# Patient Record
Sex: Female | Born: 1970
Health system: Southern US, Community
[De-identification: ages and names within clinical notes are randomized; demographics above are authoritative.]

## PROBLEM LIST (undated history)

## (undated) DIAGNOSIS — I341 Nonrheumatic mitral (valve) prolapse: Secondary | ICD-10-CM

## (undated) DIAGNOSIS — T7840XA Allergy, unspecified, initial encounter: Secondary | ICD-10-CM

## (undated) DIAGNOSIS — R011 Cardiac murmur, unspecified: Secondary | ICD-10-CM

## (undated) DIAGNOSIS — D649 Anemia, unspecified: Secondary | ICD-10-CM

## (undated) DIAGNOSIS — G8929 Other chronic pain: Secondary | ICD-10-CM

## (undated) DIAGNOSIS — F32A Depression, unspecified: Secondary | ICD-10-CM

## (undated) DIAGNOSIS — M797 Fibromyalgia: Secondary | ICD-10-CM

## (undated) DIAGNOSIS — G709 Myoneural disorder, unspecified: Secondary | ICD-10-CM

## (undated) DIAGNOSIS — F419 Anxiety disorder, unspecified: Secondary | ICD-10-CM

## (undated) DIAGNOSIS — K219 Gastro-esophageal reflux disease without esophagitis: Secondary | ICD-10-CM

## (undated) DIAGNOSIS — M549 Dorsalgia, unspecified: Secondary | ICD-10-CM

## (undated) DIAGNOSIS — G4700 Insomnia, unspecified: Secondary | ICD-10-CM

## (undated) DIAGNOSIS — I10 Essential (primary) hypertension: Secondary | ICD-10-CM

## (undated) DIAGNOSIS — F329 Major depressive disorder, single episode, unspecified: Secondary | ICD-10-CM

## (undated) DIAGNOSIS — G43909 Migraine, unspecified, not intractable, without status migrainosus: Secondary | ICD-10-CM

## (undated) DIAGNOSIS — K589 Irritable bowel syndrome without diarrhea: Secondary | ICD-10-CM

## (undated) HISTORY — DX: Gastro-esophageal reflux disease without esophagitis: K21.9

## (undated) HISTORY — DX: Nonrheumatic mitral (valve) prolapse: I34.1

## (undated) HISTORY — DX: Fibromyalgia: M79.7

## (undated) HISTORY — DX: Essential (primary) hypertension: I10

## (undated) HISTORY — DX: Anemia, unspecified: D64.9

## (undated) HISTORY — DX: Other chronic pain: G89.29

## (undated) HISTORY — PX: DILATION AND CURETTAGE OF UTERUS: SHX78

## (undated) HISTORY — DX: Dorsalgia, unspecified: M54.9

## (undated) HISTORY — PX: TONSILLECTOMY: SUR1361

## (undated) HISTORY — PX: CHOLECYSTECTOMY: SHX55

## (undated) HISTORY — DX: Major depressive disorder, single episode, unspecified: F32.9

## (undated) HISTORY — PX: SPINE SURGERY: SHX786

## (undated) HISTORY — PX: BACK SURGERY: SHX140

## (undated) HISTORY — DX: Migraine, unspecified, not intractable, without status migrainosus: G43.909

## (undated) HISTORY — DX: Myoneural disorder, unspecified: G70.9

## (undated) HISTORY — DX: Irritable bowel syndrome without diarrhea: K58.9

## (undated) HISTORY — DX: Allergy, unspecified, initial encounter: T78.40XA

## (undated) HISTORY — DX: Insomnia, unspecified: G47.00

## (undated) HISTORY — DX: Anxiety disorder, unspecified: F41.9

## (undated) HISTORY — DX: Cardiac murmur, unspecified: R01.1

## (undated) HISTORY — DX: Depression, unspecified: F32.A

---

## 2016-10-29 ENCOUNTER — Encounter: Payer: Self-pay | Admitting: Gastroenterology

## 2016-11-01 DIAGNOSIS — F17210 Nicotine dependence, cigarettes, uncomplicated: Secondary | ICD-10-CM | POA: Diagnosis not present

## 2016-11-01 DIAGNOSIS — K588 Other irritable bowel syndrome: Secondary | ICD-10-CM | POA: Diagnosis not present

## 2016-11-01 DIAGNOSIS — F331 Major depressive disorder, recurrent, moderate: Secondary | ICD-10-CM | POA: Diagnosis not present

## 2016-11-01 DIAGNOSIS — M797 Fibromyalgia: Secondary | ICD-10-CM | POA: Diagnosis not present

## 2016-11-01 DIAGNOSIS — Z79891 Long term (current) use of opiate analgesic: Secondary | ICD-10-CM | POA: Diagnosis not present

## 2016-11-01 DIAGNOSIS — M5010 Cervical disc disorder with radiculopathy, unspecified cervical region: Secondary | ICD-10-CM | POA: Diagnosis not present

## 2016-11-01 DIAGNOSIS — G43719 Chronic migraine without aura, intractable, without status migrainosus: Secondary | ICD-10-CM | POA: Diagnosis not present

## 2016-11-01 DIAGNOSIS — M5116 Intervertebral disc disorders with radiculopathy, lumbar region: Secondary | ICD-10-CM | POA: Diagnosis not present

## 2016-11-01 DIAGNOSIS — I349 Nonrheumatic mitral valve disorder, unspecified: Secondary | ICD-10-CM | POA: Diagnosis not present

## 2016-11-01 DIAGNOSIS — M545 Low back pain: Secondary | ICD-10-CM | POA: Diagnosis not present

## 2016-11-23 ENCOUNTER — Ambulatory Visit (INDEPENDENT_AMBULATORY_CARE_PROVIDER_SITE_OTHER): Payer: PPO | Admitting: Gastroenterology

## 2016-11-23 ENCOUNTER — Encounter: Payer: Self-pay | Admitting: Gastroenterology

## 2016-11-23 VITALS — BP 127/91 | HR 103 | Temp 98.1°F | Ht 64.0 in | Wt 150.2 lb

## 2016-11-23 DIAGNOSIS — R197 Diarrhea, unspecified: Secondary | ICD-10-CM

## 2016-11-23 DIAGNOSIS — R1010 Upper abdominal pain, unspecified: Secondary | ICD-10-CM | POA: Insufficient documentation

## 2016-11-23 LAB — COMPLETE METABOLIC PANEL WITH GFR
ALBUMIN: 4 g/dL (ref 3.6–5.1)
ALK PHOS: 70 U/L (ref 33–115)
ALT: 9 U/L (ref 6–29)
AST: 14 U/L (ref 10–30)
BILIRUBIN TOTAL: 0.3 mg/dL (ref 0.2–1.2)
BUN: 6 mg/dL — AB (ref 7–25)
CALCIUM: 8.9 mg/dL (ref 8.6–10.2)
CO2: 29 mmol/L (ref 20–31)
Chloride: 103 mmol/L (ref 98–110)
Creat: 0.69 mg/dL (ref 0.50–1.10)
GFR, Est African American: >89 mL/min (ref >=60)
GFR, Est Non African American: >89 mL/min (ref >=60)
GLUCOSE: 89 mg/dL (ref 65–99)
POTASSIUM: 4 mmol/L (ref 3.5–5.3)
SODIUM: 136 mmol/L (ref 135–146)
TOTAL PROTEIN: 7.1 g/dL (ref 6.1–8.1)

## 2016-11-23 LAB — CBC
HEMATOCRIT: 44.9 % (ref 35.0–45.0)
HEMOGLOBIN: 15.3 g/dL (ref 11.7–15.5)
MCH: 34.5 pg — AB (ref 27.0–33.0)
MCHC: 34.1 g/dL (ref 32.0–36.0)
MCV: 101.1 fL — ABNORMAL HIGH (ref 80.0–100.0)
MPV: 10.8 fL (ref 7.5–12.5)
Platelets: 250 10*3/uL (ref 140–400)
RBC: 4.44 MIL/uL (ref 3.80–5.10)
RDW: 13.8 % (ref 11.0–15.0)
WBC: 5.9 10*3/uL (ref 3.8–10.8)

## 2016-11-23 MED ORDER — ONDANSETRON HCL 4 MG PO TABS: 4.0000 mg | ORAL_TABLET | Freq: Three times a day (TID) | ORAL | 3 refills | Status: DC

## 2016-11-23 MED ORDER — DICYCLOMINE HCL 10 MG PO CAPS: 10.0000 mg | ORAL_CAPSULE | Freq: Three times a day (TID) | ORAL | 3 refills | Status: DC

## 2016-11-23 MED ORDER — PANTOPRAZOLE SODIUM 40 MG PO TBEC: 40.0000 mg | DELAYED_RELEASE_TABLET | Freq: Every day | ORAL | 3 refills | Status: DC

## 2016-11-23 NOTE — Assessment & Plan Note (Signed)
46 year old female with chronic postprandial loose stool, likely IBS. Remote colonoscopy in [place], and we will request records. No rectal bleeding, weight loss, or alarm signs. Gallbladder absent, so Viberzi is not an option. Start Bentyl before meals and bedtime as needed. Further recommendations after receiving colonoscopy reports.

## 2016-11-23 NOTE — Patient Instructions (Signed)
Please have blood work done today.   Stop/limit [employer] if at all possible.  Start taking these things: 1. Protonix once each morning (reflux medication) 2. Bentyl 1 capsule before meals and at bedtime, watching for dry mouth and constipation (for diarrhea) 3. Zofran scheduled with meals (for nausea) and may take at bedtime if needed.   We will get the last reports of procedures you had done. I anticipate you will need a colonoscopy and upper endoscopy in the near future.

## 2016-11-23 NOTE — Progress Notes (Signed)
_   Primary Care Physician:  Ernestine ConradBLUTH, KIRK, MD Primary Gastroenterologist:  Dr.   Antony Contrashief Complaint  Patient presents with  . Abdominal Pain    upper abd  . Bloated    after eating  . Diarrhea  . Emesis    HPI:   Cynthia Finley is a 46 y.o. female presenting today at the request of   No past medical history on file.  No past surgical history on file.  Current Outpatient Prescriptions  Medication Sig Dispense Refill  . cyclobenzaprine (FLEXERIL) 10 MG tablet Take 10 mg by mouth 3 (three) times daily.    Marland Kitchen. ibuprofen (ADVIL,MOTRIN) 600 MG tablet Take 600 mg by mouth every 6 (six) hours as needed.    . montelukast (SINGULAIR) 10 MG tablet Take 10 mg by mouth as needed.    Marland Kitchen. oxyCODONE-acetaminophen (PERCOCET) 7.5-325 MG tablet Take 1 tablet by mouth 3 (three) times daily.    . promethazine (PHENERGAN) 25 MG tablet Take 25 mg by mouth every 6 (six) hours as needed.    . zolpidem (AMBIEN) 5 MG tablet Take 5 mg by mouth at bedtime.     No current facility-administered medications for this visit.     Allergies as of 11/23/2016 - Review Complete 11/23/2016  Allergen Reaction Noted  . Aspirin  11/23/2016  . Tape Rash 11/23/2016    No family history on file.  Social History   Social History  . Marital status: Married    Spouse name: N/A  . Number of children: N/A  . Years of education: N/A   Occupational History  . Not on file.   Social History Main Topics  . Smoking status: Current Every Day Smoker  . Smokeless tobacco: Never Used  . Alcohol use No  . Drug use: No  . Sexual activity: Not on file   Other Topics Concern  . Not on file   Social History Narrative  . No narrative on file    Review of Systems: Gen: Denies any fever, chills, fatigue, weight loss, lack of appetite.  CV: Denies chest pain, heart palpitations, peripheral edema, syncope.  Resp: Denies shortness of breath at rest or with exertion. Denies wheezing or cough.  GI: Denies dysphagia or  odynophagia. Denies jaundice, hematemesis, fecal incontinence. GU : Denies urinary burning, urinary frequency, urinary hesitancy MS: Denies joint pain, muscle weakness, cramps, or limitation of movement.  Derm: Denies rash, itching, dry skin Psych: Denies depression, anxiety, memory loss, and confusion Heme: Denies bruising, bleeding, and enlarged lymph nodes.  Physical Exam: BP (!) 127/91   Pulse (!) 103   Temp 98.1 F (36.7 C) (Oral)   Ht 5\' 4"  (1.626 m)   Wt 150 lb 3.2 oz (68.1 kg)   BMI 25.78 kg/m  General:   Alert and oriented. Pleasant and cooperative. Well-nourished and well-developed.  Head:  Normocephalic and atraumatic. Eyes:  Without icterus, sclera clear and conjunctiva pink.  Ears:  Normal auditory acuity. Nose:  No deformity, discharge,  or lesions. Mouth:  No deformity or lesions, oral mucosa pink.  Neck:  Supple, without mass or thyromegaly. Lungs:  Clear to auscultation bilaterally. No wheezes, rales, or rhonchi. No distress.  Heart:  S1, S2 present without murmurs appreciated.  Abdomen:  +BS, soft, non-tender and non-distended. No HSM noted. No guarding or rebound. No masses appreciated.  Rectal:  Deferred  Msk:  Symmetrical without gross deformities. Normal posture. Pulses:  Normal pulses noted. Extremities:  Without clubbing or edema. Neurologic:  Alert and  oriented x4;  grossly normal neurologically. Skin:  Intact without significant lesions or rashes. Cervical Nodes:  No significant cervical adenopathy. Psych:  Alert and cooperative. Normal mood and affect.

## 2016-11-23 NOTE — Assessment & Plan Note (Signed)
Present at least 8 months, associated nausea and vomiting. Currently not on a PPI and drinking large amounts of mountain dew and taking Ibuprofen. Last EGD at time of colonoscopy about 5-8 years ago. Limit or avoid Mountain dew, avoid NSAIDs, start Protonix once each morning, Zofran for nausea. If no improvement, may need upper endoscopy. Further recommendations to follow.

## 2016-11-23 NOTE — Progress Notes (Signed)
Primary Care Physician:  Ernestine Conrad, MD Primary Gastroenterologist:  Dr. Darrick Penna   Chief Complaint  Patient presents with  . Abdominal Pain    upper abd  . Bloated    after eating  . Diarrhea  . Emesis    HPI:   Cynthia Finley is a 46 y.o. female presenting today at the request of her primary care provider secondary to abdominal pain and change in bowel habits. She notes colonoscopy/EGD by Dr. Raford Pitcher in [place], [place] possibly 5-8 years ago. Doesn't believe she had polyps. Told she had IBS.   Postprandial loose stool. Anything she eats results in loose stool. Very rare to have a solid BM. At least 5 loose stools a day. Abdominal pain with associated N/V within last 8 months. States urine smells like "crap" for the past 7 months. Abdominal pain upper abdomen. Intermittent, not constant. Occurs without any precipitating or relieving factors. Vomiting episodes sometimes daily, sometimes once or twice a week. No dysphagia. Occasional reflux. No loss of appetite. Afraid to eat because she knows she may have N/V. Gallbladder absent. Ibuprofen once to twice a day. Chronic narcotics for at least 20 years due to back pain. Phenergan prn. Always keeps bottle of anti-diarrheal pills on hand. Had taken Prilosec in the past but hasn't had it refilled in awhile. No rectal bleeding but states sometimes her stool looks "black". Drinks about 10 cans of [employer] approximately a day. No weight loss. Diarrhea present prior to cholecystectomy.   Past Medical History:  Diagnosis Date  . Anxiety   . Chronic back pain   . Depression   . GERD (gastroesophageal reflux disease)   . IBS (irritable bowel syndrome)   . Insomnia   . Migraine   . Mitral valve prolapse     Past Surgical History:  Procedure Laterality Date  . BACK SURGERY     X 6  . CHOLECYSTECTOMY    . DILATION AND CURETTAGE OF UTERUS    . TONSILLECTOMY      Current Outpatient Prescriptions  Medication Sig Dispense Refill    . cyclobenzaprine (FLEXERIL) 10 MG tablet Take 10 mg by mouth 3 (three) times daily.    Marland Kitchen ibuprofen (ADVIL,MOTRIN) 600 MG tablet Take 600 mg by mouth every 6 (six) hours as needed.    . montelukast (SINGULAIR) 10 MG tablet Take 10 mg by mouth as needed.    Marland Kitchen oxyCODONE-acetaminophen (PERCOCET) 7.5-325 MG tablet Take 1 tablet by mouth 3 (three) times daily.    . promethazine (PHENERGAN) 25 MG tablet Take 25 mg by mouth every 6 (six) hours as needed.    . zolpidem (AMBIEN) 5 MG tablet Take 5 mg by mouth at bedtime.    . dicyclomine (BENTYL) 10 MG capsule Take 1 capsule (10 mg total) by mouth 4 (four) times daily -  before meals and at bedtime. 120 capsule 3  . ondansetron (ZOFRAN) 4 MG tablet Take 1 tablet (4 mg total) by mouth 4 (four) times daily -  before meals and at bedtime. 120 tablet 3  . pantoprazole (PROTONIX) 40 MG tablet Take 1 tablet (40 mg total) by mouth daily. Take 30 minutes before breakfast daily. 90 tablet 3   No current facility-administered medications for this visit.     Allergies as of 11/23/2016 - Review Complete 11/23/2016  Allergen Reaction Noted  . Aspirin  11/23/2016  . Tape Rash 11/23/2016    Family History  Problem Relation Age of Onset  . Colon  cancer Neg Hx   . Colon polyps Neg Hx     Social History   Social History  . Marital status: Married    Spouse name: N/A  . Number of children: N/A  . Years of education: N/A   Occupational History  . disability    Social History Main Topics  . Smoking status: Current Every Day Smoker    Packs/day: 1.00    Years: 23.00  . Smokeless tobacco: Never Used  . Alcohol use No  . Drug use: No  . Sexual activity: Not on file   Other Topics Concern  . Not on file   Social History Narrative  . No narrative on file    Review of Systems: As mentioned in HPI   Physical Exam: BP (!) 127/91   Pulse (!) 103   Temp 98.1 F (36.7 C) (Oral)   Ht 5\' 4"  (1.626 m)   Wt 150 lb 3.2 oz (68.1 kg)   BMI 25.78  kg/m  General:   Alert and oriented. Pleasant and cooperative. Well-nourished and well-developed.  Head:  Normocephalic and atraumatic. Eyes:  Without icterus, sclera clear and conjunctiva pink.  Ears:  Normal auditory acuity. Nose:  No deformity, discharge,  or lesions. Mouth:  Poor dentition  Lungs:  Clear to auscultation bilaterally.  Heart:  S1, S2 present without murmurs appreciated.  Abdomen:  +BS, soft, mild epigastric tenderness to palpation and non-distended. No HSM noted. No guarding or rebound. No masses appreciated.  Rectal:  Deferred  Msk:  Symmetrical without gross deformities. Normal posture. Extremities:  Without edema. Neurologic:  Alert and  oriented x4 Skin:  Intact without significant lesions or rashes. Psych:  Alert and cooperative. Normal mood and affect.

## 2016-11-26 LAB — TISSUE TRANSGLUTAMINASE, IGA: Tissue Transglutaminase Ab, IgA: 1 U/mL (ref <4)

## 2016-11-26 LAB — IGA: IGA: 303 mg/dL (ref 81–463)

## 2016-11-26 NOTE — Progress Notes (Signed)
CC'ED TO PCP 

## 2016-11-26 NOTE — Progress Notes (Signed)
Received fax [facility]. There are no EGD or TCS reports. It appears patient was a "no show" with Dr. Ruffin FrederickBarritt on 03/24/08.   How is patient since starting a PPI and Bentyl? If she has no significant improvement, we can pursue TCS/EGD with PROPOFOL with Dr. Darrick PennaFields.   Gelene MinkAnna W. Boone, ANP-BC [facility]

## 2016-11-27 DIAGNOSIS — F331 Major depressive disorder, recurrent, moderate: Secondary | ICD-10-CM | POA: Diagnosis not present

## 2016-11-27 DIAGNOSIS — M797 Fibromyalgia: Secondary | ICD-10-CM | POA: Diagnosis not present

## 2016-11-27 DIAGNOSIS — M5116 Intervertebral disc disorders with radiculopathy, lumbar region: Secondary | ICD-10-CM | POA: Diagnosis not present

## 2016-11-27 DIAGNOSIS — Z79891 Long term (current) use of opiate analgesic: Secondary | ICD-10-CM | POA: Diagnosis not present

## 2016-11-27 DIAGNOSIS — M545 Low back pain: Secondary | ICD-10-CM | POA: Diagnosis not present

## 2016-11-27 DIAGNOSIS — K588 Other irritable bowel syndrome: Secondary | ICD-10-CM | POA: Diagnosis not present

## 2016-11-27 DIAGNOSIS — G43719 Chronic migraine without aura, intractable, without status migrainosus: Secondary | ICD-10-CM | POA: Diagnosis not present

## 2016-11-27 DIAGNOSIS — I349 Nonrheumatic mitral valve disorder, unspecified: Secondary | ICD-10-CM | POA: Diagnosis not present

## 2016-11-27 DIAGNOSIS — F17210 Nicotine dependence, cigarettes, uncomplicated: Secondary | ICD-10-CM | POA: Diagnosis not present

## 2016-11-27 DIAGNOSIS — M5010 Cervical disc disorder with radiculopathy, unspecified cervical region: Secondary | ICD-10-CM | POA: Diagnosis not present

## 2016-11-28 ENCOUNTER — Other Ambulatory Visit: Payer: Self-pay

## 2016-11-28 DIAGNOSIS — R197 Diarrhea, unspecified: Secondary | ICD-10-CM

## 2016-11-28 DIAGNOSIS — R109 Unspecified abdominal pain: Secondary | ICD-10-CM

## 2016-11-28 MED ORDER — PEG 3350-KCL-NA BICARB-NACL 420 G PO SOLR: 4000.0000 mL | ORAL | 0 refills | Status: DC

## 2016-11-28 NOTE — Progress Notes (Signed)
LMOM to call.

## 2016-11-28 NOTE — Progress Notes (Signed)
PT called and said she can't see much difference. She is taking Protonix once a day and the Zofran and Bentyl qid. She hasn't vomited, but has had nausea. Ok to schedule the TCS/EGD.

## 2016-11-28 NOTE — Progress Notes (Signed)
Pt is set up for TCS/EGD/PROPOFOL on 12/18/16 @ 10:30 am . She is aware and instructions are going out in the mail.

## 2016-11-29 ENCOUNTER — Encounter: Payer: Self-pay | Admitting: Gastroenterology

## 2016-11-29 ENCOUNTER — Other Ambulatory Visit: Payer: Self-pay

## 2016-11-30 ENCOUNTER — Encounter: Payer: Self-pay | Admitting: Gastroenterology

## 2016-11-30 NOTE — Progress Notes (Signed)
Ms. Cynthia Finley: Your celiac testing was negative. Your liver numbers are normal. We will proceed with the procedures as planned! (I sent a note in MyChart)

## 2016-12-03 NOTE — Progress Notes (Signed)
Noted  

## 2016-12-04 ENCOUNTER — Telehealth: Payer: Self-pay | Admitting: Gastroenterology

## 2016-12-04 NOTE — Telephone Encounter (Signed)
Can we get any labs from Dr. Pauletta BrownsBluth's office? Specifically CBC. Thanks!  Tobi BastosAnna

## 2016-12-04 NOTE — Telephone Encounter (Signed)
Labs requested

## 2016-12-05 ENCOUNTER — Telehealth: Payer: Self-pay

## 2016-12-05 NOTE — Telephone Encounter (Signed)
pts insurance will only pay for zofran if the pts nausea is caused by one of the following:  1- nausea d/t chemo 2- nausea d/t radiation 3- prevention of post operative N/V 4- pediatric gastroenteritis  5- hyperememsis gravidarum  [facility]

## 2016-12-10 NOTE — Telephone Encounter (Signed)
Received a fax that there were no labs since April 13, 2016. Can we have them send whatever they have? Whatever the last CBC was on file. I am trying to compare it to what was currently done.

## 2016-12-11 NOTE — Telephone Encounter (Signed)
Tried to call. Bad connection.

## 2016-12-11 NOTE — Telephone Encounter (Signed)
Requested last labs and last CBC

## 2016-12-11 NOTE — Telephone Encounter (Signed)
Noted. Will have to use phenergan prn. Patient should have this already.

## 2016-12-12 NOTE — Telephone Encounter (Signed)
LMOM to call.

## 2016-12-12 NOTE — Patient Instructions (Signed)
Cynthia LankKimberly Hauger  12/12/2016     @PREFPERIOPPHARMACY @   Your procedure is scheduled on  12/18/2016  [facility] at  900  A.M.  Call this number if you have problems the morning of surgery:  [phone]   Remember:  Do not eat food or drink liquids after midnight.  Take these medicines the morning of surgery with A SIP OF WATER  Flexaril, singulair, zofran, percocet, protonix, phenergan.   Do not wear jewelry, make-up or nail polish.  Do not wear lotions, powders, or perfumes, or deoderant.  Do not shave 48 hours prior to surgery.  Men may shave face and neck.  Do not bring valuables to the hospital.  [facility] any belongings or valuables.  Contacts, dentures or bridgework may not be worn into surgery.  Leave your suitcase in the car.  After surgery it may be brought to your room.  For patients admitted to the hospital, discharge time will be determined by your treatment team.  Patients discharged the day of surgery will not be allowed to drive home.   Name and phone number of your driver:   family Special instructions:  Follow the diet and prep instructions given to you by Dr Evelina DunField's office.  Please read over the following fact sheets that you were given. Anesthesia Post-op Instructions and Care and Recovery After Surgery       Esophagogastroduodenoscopy Introduction Esophagogastroduodenoscopy (EGD) is a procedure to examine the lining of the esophagus, stomach, and first part of the small intestine (duodenum). This procedure is done to check for problems such as inflammation, bleeding, ulcers, or growths. During this procedure, a long, flexible, lighted tube with a camera attached (endoscope) is inserted down the throat. Tell a health care provider about:  Any allergies you have.  All medicines you are taking, including vitamins, herbs, eye drops, creams, and over-the-counter medicines.  Any problems you or family  members have had with anesthetic medicines.  Any blood disorders you have.  Any surgeries you have had.  Any medical conditions you have.  Whether you are pregnant or may be pregnant. What are the risks? Generally, this is a safe procedure. However, problems may occur, including:  Infection.  Bleeding.  A tear (perforation) in the esophagus, stomach, or duodenum.  Trouble breathing.  Excessive sweating.  Spasms of the larynx.  A slowed heartbeat.  Low blood pressure. What happens before the procedure?  Follow instructions from your health care provider about eating or drinking restrictions.  Ask your health care provider about:  Changing or stopping your regular medicines. This is especially important if you are taking diabetes medicines or blood thinners.  Taking medicines such as aspirin and ibuprofen. These medicines can thin your blood. Do not take these medicines before your procedure if your health care provider instructs you not to.  Plan to have someone take you home after the procedure.  If you wear dentures, be ready to remove them before the procedure. What happens during the procedure?  To reduce your risk of infection, your health care team will wash or sanitize their hands.  An IV tube will be put in a vein in your hand or arm. You will get medicines and fluids through this tube.  You will be given one or more of the following:  A medicine to help you relax (sedative).  A medicine to numb the area (local anesthetic). This  medicine may be sprayed into your throat. It will make you feel more comfortable and keep you from gagging or coughing during the procedure.  A medicine for pain.  A mouth guard may be placed in your mouth to protect your teeth and to keep you from biting on the endoscope.  You will be asked to lie on your left side.  The endoscope will be lowered down your throat into your esophagus, stomach, and duodenum.  Air will be put  into the endoscope. This will help your health care provider see better.  The lining of your esophagus, stomach, and duodenum will be examined.  Your health care provider may:  Take a tissue sample so it can be looked at in a lab (biopsy).  Remove growths.  Remove objects (foreign bodies) that are stuck.  Treat any bleeding with medicines or other devices that stop tissue from bleeding.  Widen (dilate) or stretch narrowed areas of your esophagus and stomach.  The endoscope will be taken out. The procedure may vary among health care providers and hospitals. What happens after the procedure?  Your blood pressure, heart rate, breathing rate, and blood oxygen level will be monitored often until the medicines you were given have worn off.  Do not eat or drink anything until the numbing medicine has worn off and your gag reflex has returned. This information is not intended to replace advice given to you by your health care provider. Make sure you discuss any questions you have with your health care provider. Document Released: 02/08/2005 Document Revised: 03/15/2016 Document Reviewed: 09/01/2015  2017 Elsevier Esophagogastroduodenoscopy, Care After Introduction Refer to this sheet in the next few weeks. These instructions provide you with information about caring for yourself after your procedure. Your health care provider may also give you more specific instructions. Your treatment has been planned according to current medical practices, but problems sometimes occur. Call your health care provider if you have any problems or questions after your procedure. What can I expect after the procedure? After the procedure, it is common to have:  A sore throat.  Nausea.  Bloating.  Dizziness.  Fatigue. Follow these instructions at home:  Do not eat or drink anything until the numbing medicine (local anesthetic) has worn off and your gag reflex has returned. You will know that the local  anesthetic has worn off when you can swallow comfortably.  Do not drive for 24 hours if you received a medicine to help you relax (sedative).  If your health care provider took a tissue sample for testing during the procedure, make sure to get your test results. This is your responsibility. Ask your health care provider or the department performing the test when your results will be ready.  Keep all follow-up visits as told by your health care provider. This is important. Contact a health care provider if:  You cannot stop coughing.  You are not urinating.  You are urinating less than usual. Get help right away if:  You have trouble swallowing.  You cannot eat or drink.  You have throat or chest pain that gets worse.  You are dizzy or light-headed.  You faint.  You have nausea or vomiting.  You have chills.  You have a fever.  You have severe abdominal pain.  You have black, tarry, or bloody stools. This information is not intended to replace advice given to you by your health care provider. Make sure you discuss any questions you have with your health care  provider. Document Released: 09/24/2012 Document Revised: 03/15/2016 Document Reviewed: 09/01/2015  2017 Elsevier  Colonoscopy, Adult A colonoscopy is an exam to look at the entire large intestine. During the exam, a lubricated, bendable tube is inserted into the anus and then passed into the rectum, colon, and other parts of the large intestine. A colonoscopy is often done as a part of normal colorectal screening or in response to certain symptoms, such as anemia, persistent diarrhea, abdominal pain, and blood in the stool. The exam can help screen for and diagnose medical problems, including:  Tumors.  Polyps.  Inflammation.  Areas of bleeding. Tell a health care provider about:  Any allergies you have.  All medicines you are taking, including vitamins, herbs, eye drops, creams, and over-the-counter  medicines.  Any problems you or family members have had with anesthetic medicines.  Any blood disorders you have.  Any surgeries you have had.  Any medical conditions you have.  Any problems you have had passing stool. What are the risks? Generally, this is a safe procedure. However, problems may occur, including:  Bleeding.  A tear in the intestine.  A reaction to medicines given during the exam.  Infection (rare). What happens before the procedure? Eating and drinking restrictions  Follow instructions from your health care provider about eating and drinking, which may include:  A few days before the procedure - follow a low-fiber diet. Avoid nuts, seeds, dried fruit, raw fruits, and vegetables.  1-3 days before the procedure - follow a clear liquid diet. Drink only clear liquids, such as clear broth or bouillon, black coffee or tea, clear juice, clear soft drinks or sports drinks, gelatin desert, and popsicles. Avoid any liquids that contain red or purple dye.  On the day of the procedure - do not eat or drink anything during the 2 hours before the procedure, or within the time period that your health care provider recommends. Bowel prep  If you were prescribed an oral bowel prep to clean out your colon:  Take it as told by your health care provider. Starting the day before your procedure, you will need to drink a large amount of medicated liquid. The liquid will cause you to have multiple loose stools until your stool is almost clear or light green.  If your skin or anus gets irritated from diarrhea, you may use these to relieve the irritation:  Medicated wipes, such as adult wet wipes with aloe and vitamin E.  A skin soothing-product like petroleum jelly.  If you vomit while drinking the bowel prep, take a break for up to 60 minutes and then begin the bowel prep again. If vomiting continues and you cannot take the bowel prep without vomiting, call your health care  provider. General instructions  Ask your health care provider about changing or stopping your regular medicines. This is especially important if you are taking diabetes medicines or blood thinners.  Plan to have someone take you home from the hospital or clinic. What happens during the procedure?  An IV tube may be inserted into one of your veins.  You will be given medicine to help you relax (sedative).  To reduce your risk of infection:  Your health care team will wash or sanitize their hands.  Your anal area will be washed with soap.  You will be asked to lie on your side with your knees bent.  Your health care provider will lubricate a long, thin, flexible tube. The tube will have a camera and  a light on the end.  The tube will be inserted into your anus.  The tube will be gently eased through your rectum and colon.  Air will be delivered into your colon to keep it open. You may feel some pressure or cramping.  The camera will be used to take images during the procedure.  A small tissue sample may be removed from your body to be examined under a microscope (biopsy). If any potential problems are found, the tissue will be sent to a lab for testing.  If small polyps are found, your health care provider may remove them and have them checked for cancer cells.  The tube that was inserted into your anus will be slowly removed. The procedure may vary among health care providers and hospitals. What happens after the procedure?  Your blood pressure, heart rate, breathing rate, and blood oxygen level will be monitored until the medicines you were given have worn off.  Do not drive for 24 hours after the exam.  You may have a small amount of blood in your stool.  You may pass gas and have mild abdominal cramping or bloating due to the air that was used to inflate your colon during the exam.  It is up to you to get the results of your procedure. Ask your health care provider, or  the department performing the procedure, when your results will be ready. This information is not intended to replace advice given to you by your health care provider. Make sure you discuss any questions you have with your health care provider. Document Released: 10/05/2000 Document Revised: 04/27/2016 Document Reviewed: 12/20/2015 Elsevier Interactive Patient Education  2017 Else[employer] Inc[employer]  Colonoscopy, Adult, Care After This sheet gives you information about how to care for yourself after your procedure. Your health care provider may also give you more specific instructions. If you have problems or questions, contact your health care provider. What can I expect after the procedure? After the procedure, it is common to have:  A small amount of blood in your stool for 24 hours after the procedure.  Some gas.  Mild abdominal cramping or bloating. Follow these instructions at home: General instructions  For the first 24 hours after the procedure:  Do not drive or use machinery.  Do not sign important documents.  Do not drink alcohol.  Do your regular daily activities at a slower pace than normal.  Eat soft, easy-to-digest foods.  Rest often.  Take over-the-counter or prescription medicines only as told by your health care provider.  It is up to you to get the results of your procedure. Ask your health care provider, or the department performing the procedure, when your results will be ready. Relieving cramping and bloating  Try walking around when you have cramps or feel bloated.  Apply heat to your abdomen as told by your health care provider. Use a heat source that your health care provider recommends, such as a moist heat pack or a heating pad.  Place a towel between your skin and the heat source.  Leave the heat on for 20-30 minutes.  Remove the heat if your skin turns bright red. This is especially important if you are unable to feel pain, heat, or cold. You may have a  greater risk of getting burned. Eating and drinking  Drink enough fluid to keep your urine clear or pale yellow.  Resume your normal diet as instructed by your health care provider. Avoid heavy or fried foods that  are hard to digest.  Avoid drinking alcohol for as long as instructed by your health care provider. Contact a health care provider if:  You have blood in your stool 2-3 days after the procedure. Get help right away if:  You have more than a small spotting of blood in your stool.  You pass large blood clots in your stool.  Your abdomen is swollen.  You have nausea or vomiting.  You have a fever.  You have increasing abdominal pain that is not relieved with medicine. This information is not intended to replace advice given to you by your health care provider. Make sure you discuss any questions you have with your health care provider. Document Released: 05/22/2004 Document Revised: 07/02/2016 Document Reviewed: 12/20/2015 Elsevier Interactive Patient Education  2017 Elsevier Inc.  Monitored Anesthesia Care Anesthesia is a term that refers to techniques, procedures, and medicines that help a person stay safe and comfortable during a medical procedure. Monitored anesthesia care, or sedation, is one type of anesthesia. Your anesthesia specialist may recommend sedation if you will be having a procedure that does not require you to be unconscious, such as:  Cataract surgery.  A dental procedure.  A biopsy.  A colonoscopy. During the procedure, you may receive a medicine to help you relax (sedative). There are three levels of sedation:  Mild sedation. At this level, you may feel awake and relaxed. You will be able to follow directions.  Moderate sedation. At this level, you will be sleepy. You may not remember the procedure.  Deep sedation. At this level, you will be asleep. You will not remember the procedure. The more medicine you are given, the deeper your level of  sedation will be. Depending on how you respond to the procedure, the anesthesia specialist may change your level of sedation or the type of anesthesia to fit your needs. An anesthesia specialist will monitor you closely during the procedure. Let your health care provider know about:  Any allergies you have.  All medicines you are taking, including vitamins, herbs, eye drops, creams, and over-the-counter medicines.  Any use of steroids (by mouth or as a cream).  Any problems you or family members have had with sedatives and anesthetic medicines.  Any blood disorders you have.  Any surgeries you have had.  Any medical conditions you have, such as sleep apnea.  Whether you are pregnant or may be pregnant.  Any use of cigarettes, alcohol, or street drugs. What are the risks? Generally, this is a safe procedure. However, problems may occur, including:  Getting too much medicine (oversedation).  Nausea.  Allergic reaction to medicines.  Trouble breathing. If this happens, a breathing tube may be used to help with breathing. It will be removed when you are awake and breathing on your own.  Heart trouble.  Lung trouble. Before the procedure Staying hydrated  Follow instructions from your health care provider about hydration, which may include:  Up to 2 hours before the procedure - you may continue to drink clear liquids, such as water, clear fruit juice, black coffee, and plain tea. Eating and drinking restrictions  Follow instructions from your health care provider about eating and drinking, which may include:  8 hours before the procedure - stop eating heavy meals or foods such as meat, fried foods, or fatty foods.  6 hours before the procedure - stop eating light meals or foods, such as toast or cereal.  6 hours before the procedure - stop drinking milk or  drinks that contain milk.  2 hours before the procedure - stop drinking clear liquids. Medicines  Ask your health  care provider about:  Changing or stopping your regular medicines. This is especially important if you are taking diabetes medicines or blood thinners.  Taking medicines such as aspirin and ibuprofen. These medicines can thin your blood. Do not take these medicines before your procedure if your health care provider instructs you not to. Tests and exams  You will have a physical exam.  You may have blood tests done to show:  How well your kidneys and liver are working.  How well your blood can clot.  General instructions  Plan to have someone take you home from the hospital or clinic.  If you will be going home right after the procedure, plan to have someone with you for 24 hours. What happens during the procedure?  Your blood pressure, heart rate, breathing, level of pain and overall condition will be monitored.  An IV tube will be inserted into one of your veins.  Your anesthesia specialist will give you medicines as needed to keep you comfortable during the procedure. This may mean changing the level of sedation.  The procedure will be performed. After the procedure  Your blood pressure, heart rate, breathing rate, and blood oxygen level will be monitored until the medicines you were given have worn off.  Do not drive for 24 hours if you received a sedative.  You may:  Feel sleepy, clumsy, or nauseous.  Feel forgetful about what happened after the procedure.  Have a sore throat if you had a breathing tube during the procedure.  Vomit. This information is not intended to replace advice given to you by your health care provider. Make sure you discuss any questions you have with your health care provider. Document Released: 07/04/2005 Document Revised: 03/16/2016 Document Reviewed: 01/29/2016 Elsevier Interactive Patient Education  2017 Elsevier Inc. Monitored Anesthesia Care, Care After These instructions provide you with information about caring for yourself after  your procedure. Your health care provider may also give you more specific instructions. Your treatment has been planned according to current medical practices, but problems sometimes occur. Call your health care provider if you have any problems or questions after your procedure. What can I expect after the procedure? After your procedure, it is common to:  Feel sleepy for several hours.  Feel clumsy and have poor balance for several hours.  Feel forgetful about what happened after the procedure.  Have poor judgment for several hours.  Feel nauseous or vomit.  Have a sore throat if you had a breathing tube during the procedure. Follow these instructions at home: For at least 24 hours after the procedure:   Do not:  Participate in activities in which you could fall or become injured.  Drive.  Use heavy machinery.  Drink alcohol.  Take sleeping pills or medicines that cause drowsiness.  Make important decisions or sign legal documents.  Take care of children on your own.  Rest. Eating and drinking  Follow the diet that is recommended by your health care provider.  If you vomit, drink water, juice, or soup when you can drink without vomiting.  Make sure you have little or no nausea before eating solid foods. General instructions  Have a responsible adult stay with you until you are awake and alert.  Take over-the-counter and prescription medicines only as told by your health care provider.  If you smoke, do not smoke without supervision.  Keep all follow-up visits as told by your health care provider. This is important. Contact a health care provider if:  You keep feeling nauseous or you keep vomiting.  You feel light-headed.  You develop a rash.  You have a fever. Get help right away if:  You have trouble breathing. This information is not intended to replace advice given to you by your health care provider. Make sure you discuss any questions you have  with your health care provider. Document Released: 01/29/2016 Document Revised: 05/30/2016 Document Reviewed: 01/29/2016 Elsevier Interactive Patient Education  2017 [employer].

## 2016-12-12 NOTE — Telephone Encounter (Signed)
Last CBC from June 2017 with Hgb 15, Hct 43.7, MCV 98.6 (just marginally elevated), Platelets 269.   Please let patient know this was overall normal in the past. She has pre-admission testing on 2/22. We will see what CBC shows then. If MCV still elevated, will order B12, folate, TSH.

## 2016-12-12 NOTE — Telephone Encounter (Signed)
Pt said she got the Zofran the day she went for it and did not have to pay much for it, she can't remember exactly.

## 2016-12-12 NOTE — Telephone Encounter (Signed)
Pt is aware.  

## 2016-12-13 ENCOUNTER — Encounter (HOSPITAL_COMMUNITY): Payer: Self-pay

## 2016-12-13 ENCOUNTER — Encounter (HOSPITAL_COMMUNITY)
Admission: RE | Admit: 2016-12-13 | Discharge: 2016-12-13 | Disposition: A | Discharge status: Home / Self Care | Payer: PPO | Source: Ambulatory Visit | Attending: Gastroenterology | Admitting: Gastroenterology

## 2016-12-13 DIAGNOSIS — R1110 Vomiting, unspecified: Secondary | ICD-10-CM | POA: Diagnosis not present

## 2016-12-13 DIAGNOSIS — R140 Abdominal distension (gaseous): Secondary | ICD-10-CM | POA: Diagnosis not present

## 2016-12-13 DIAGNOSIS — Z9889 Other specified postprocedural states: Secondary | ICD-10-CM | POA: Insufficient documentation

## 2016-12-13 DIAGNOSIS — R197 Diarrhea, unspecified: Secondary | ICD-10-CM | POA: Insufficient documentation

## 2016-12-13 DIAGNOSIS — R1010 Upper abdominal pain, unspecified: Secondary | ICD-10-CM | POA: Diagnosis not present

## 2016-12-13 DIAGNOSIS — F17210 Nicotine dependence, cigarettes, uncomplicated: Secondary | ICD-10-CM | POA: Insufficient documentation

## 2016-12-13 DIAGNOSIS — Z9049 Acquired absence of other specified parts of digestive tract: Secondary | ICD-10-CM | POA: Insufficient documentation

## 2016-12-13 DIAGNOSIS — Z01812 Encounter for preprocedural laboratory examination: Secondary | ICD-10-CM | POA: Insufficient documentation

## 2016-12-13 LAB — CBC WITH DIFFERENTIAL/PLATELET
Basophils Absolute: 0 10*3/uL (ref 0.0–0.1)
Basophils Relative: 0 %
EOS ABS: 0.1 10*3/uL (ref 0.0–0.7)
EOS PCT: 1 %
HCT: 43.1 % (ref 36.0–46.0)
Hemoglobin: 14.4 g/dL (ref 12.0–15.0)
LYMPHS ABS: 1 10*3/uL (ref 0.7–4.0)
Lymphocytes Relative: 13 %
MCH: 34.1 pg — AB (ref 26.0–34.0)
MCHC: 33.4 g/dL (ref 30.0–36.0)
MCV: 102.1 fL — ABNORMAL HIGH (ref 78.0–100.0)
MONO ABS: 0.5 10*3/uL (ref 0.1–1.0)
Monocytes Relative: 7 %
Neutro Abs: 6 10*3/uL (ref 1.7–7.7)
Neutrophils Relative %: 79 %
PLATELETS: 254 10*3/uL (ref 150–400)
RBC: 4.22 MIL/uL (ref 3.87–5.11)
RDW: 12.7 % (ref 11.5–15.5)
WBC: 7.6 10*3/uL (ref 4.0–10.5)

## 2016-12-13 LAB — BASIC METABOLIC PANEL
Anion gap: 7 (ref 5–15)
BUN: 6 mg/dL (ref 6–20)
CALCIUM: 8.7 mg/dL — AB (ref 8.9–10.3)
CO2: 28 mmol/L (ref 22–32)
CREATININE: 0.64 mg/dL (ref 0.44–1.00)
Chloride: 103 mmol/L (ref 101–111)
GFR calc Af Amer: >60 mL/min (ref >60)
GLUCOSE: 124 mg/dL — AB (ref 65–99)
POTASSIUM: 3.5 mmol/L (ref 3.5–5.1)
SODIUM: 138 mmol/L (ref 135–145)

## 2016-12-13 NOTE — Telephone Encounter (Signed)
I received a call from Endoscopy staff asking if the patient needed additional labs.  I verified with Tyler AasDoris they just need to do their normal preadmission labs and AB will make recommendations from those results.    Please see below.

## 2016-12-14 ENCOUNTER — Encounter: Payer: Self-pay | Admitting: Gastroenterology

## 2016-12-17 ENCOUNTER — Other Ambulatory Visit: Payer: Self-pay

## 2016-12-17 ENCOUNTER — Encounter: Payer: Self-pay | Admitting: Gastroenterology

## 2016-12-17 DIAGNOSIS — R7989 Other specified abnormal findings of blood chemistry: Secondary | ICD-10-CM | POA: Diagnosis not present

## 2016-12-17 DIAGNOSIS — R197 Diarrhea, unspecified: Secondary | ICD-10-CM | POA: Diagnosis not present

## 2016-12-17 NOTE — Telephone Encounter (Signed)
[facility]

## 2016-12-17 NOTE — Telephone Encounter (Signed)
CBC reviewed. Needs B12, TSh, folate. If this is normal, will refer to hematology.

## 2016-12-18 ENCOUNTER — Encounter (HOSPITAL_COMMUNITY): Payer: Self-pay | Admitting: Anesthesiology

## 2016-12-18 ENCOUNTER — Ambulatory Visit (HOSPITAL_COMMUNITY): Admission: RE | Admit: 2016-12-18 | Payer: PPO | Source: Ambulatory Visit | Admitting: Gastroenterology

## 2016-12-18 ENCOUNTER — Encounter (HOSPITAL_COMMUNITY): Admission: RE | Payer: Self-pay | Source: Ambulatory Visit

## 2016-12-18 SURGERY — COLONOSCOPY WITH PROPOFOL
Anesthesia: Monitor Anesthesia Care

## 2016-12-18 NOTE — Telephone Encounter (Signed)
Pt is aware and will try to go by the lab today.

## 2016-12-19 DIAGNOSIS — F17210 Nicotine dependence, cigarettes, uncomplicated: Secondary | ICD-10-CM | POA: Diagnosis not present

## 2016-12-19 DIAGNOSIS — M5010 Cervical disc disorder with radiculopathy, unspecified cervical region: Secondary | ICD-10-CM | POA: Diagnosis not present

## 2016-12-19 DIAGNOSIS — Z79899 Other long term (current) drug therapy: Secondary | ICD-10-CM | POA: Diagnosis not present

## 2016-12-19 DIAGNOSIS — F331 Major depressive disorder, recurrent, moderate: Secondary | ICD-10-CM | POA: Diagnosis not present

## 2016-12-19 DIAGNOSIS — I349 Nonrheumatic mitral valve disorder, unspecified: Secondary | ICD-10-CM | POA: Diagnosis not present

## 2016-12-19 DIAGNOSIS — G43719 Chronic migraine without aura, intractable, without status migrainosus: Secondary | ICD-10-CM | POA: Diagnosis not present

## 2016-12-19 DIAGNOSIS — Z79891 Long term (current) use of opiate analgesic: Secondary | ICD-10-CM | POA: Diagnosis not present

## 2016-12-19 DIAGNOSIS — M5116 Intervertebral disc disorders with radiculopathy, lumbar region: Secondary | ICD-10-CM | POA: Diagnosis not present

## 2016-12-19 DIAGNOSIS — K588 Other irritable bowel syndrome: Secondary | ICD-10-CM | POA: Diagnosis not present

## 2016-12-19 DIAGNOSIS — M545 Low back pain: Secondary | ICD-10-CM | POA: Diagnosis not present

## 2016-12-19 DIAGNOSIS — M797 Fibromyalgia: Secondary | ICD-10-CM | POA: Diagnosis not present

## 2016-12-19 LAB — TSH: TSH: 3.54 mIU/L

## 2016-12-20 LAB — FOLATE: Folate: 1.8 ng/mL — ABNORMAL LOW (ref >5.4)

## 2016-12-20 LAB — VITAMIN B12: Vitamin B-12: 612 pg/mL (ref 200–1100)

## 2016-12-24 ENCOUNTER — Encounter: Payer: Self-pay | Admitting: Gastroenterology

## 2016-12-24 ENCOUNTER — Other Ambulatory Visit: Payer: Self-pay | Admitting: Gastroenterology

## 2016-12-24 MED ORDER — FOLIC ACID 1 MG PO TABS: 1.0000 mg | ORAL_TABLET | Freq: Every day | ORAL | 3 refills | Status: DC

## 2016-12-24 NOTE — Progress Notes (Signed)
Ms. Cynthia Finley has low folate levels. She needs to take oral folic acid daily, with recheck of CBC and folate in 2 weeks. I'm curious why she has this deficiency. I saw she had to cancel her EGD. Will be interesting to see if any evidence of celiac disease. I am sending this in to her pharmacy. Please let her know I am sorry to hear about her husband's stroke.

## 2016-12-25 ENCOUNTER — Other Ambulatory Visit: Payer: Self-pay

## 2016-12-25 DIAGNOSIS — R109 Unspecified abdominal pain: Secondary | ICD-10-CM

## 2016-12-25 DIAGNOSIS — R197 Diarrhea, unspecified: Secondary | ICD-10-CM

## 2016-12-25 NOTE — Progress Notes (Signed)
PT is aware. Lab orders have been entered and released. [facility] will go to [facility].  She will let us know if she is able to do the EGD.

## 2017-01-01 ENCOUNTER — Encounter: Payer: Self-pay | Admitting: Gastroenterology

## 2017-01-01 DIAGNOSIS — I509 Heart failure, unspecified: Secondary | ICD-10-CM | POA: Diagnosis not present

## 2017-01-01 DIAGNOSIS — R600 Localized edema: Secondary | ICD-10-CM | POA: Diagnosis not present

## 2017-01-02 ENCOUNTER — Telehealth: Payer: Self-pay | Admitting: Gastroenterology

## 2017-01-02 ENCOUNTER — Encounter: Payer: Self-pay | Admitting: Gastroenterology

## 2017-01-02 NOTE — Telephone Encounter (Signed)
Doris:  In addition to the labs already ordered, let's postpone from 2 weeks to four weeks and add the following:  methylmalonic acid, homocysteine level, CBC with diff, B12, and folate.

## 2017-01-04 ENCOUNTER — Other Ambulatory Visit: Payer: Self-pay

## 2017-01-04 DIAGNOSIS — R197 Diarrhea, unspecified: Secondary | ICD-10-CM

## 2017-01-04 DIAGNOSIS — R109 Unspecified abdominal pain: Secondary | ICD-10-CM

## 2017-01-04 NOTE — Telephone Encounter (Signed)
LMOM that she is not to do previously ordered labs in 2 weeks. I am mailing her orders for all labs needed in 4 weeks and to call if she has questions!

## 2017-01-15 DIAGNOSIS — K588 Other irritable bowel syndrome: Secondary | ICD-10-CM | POA: Diagnosis not present

## 2017-01-15 DIAGNOSIS — Z79891 Long term (current) use of opiate analgesic: Secondary | ICD-10-CM | POA: Diagnosis not present

## 2017-01-15 DIAGNOSIS — F331 Major depressive disorder, recurrent, moderate: Secondary | ICD-10-CM | POA: Diagnosis not present

## 2017-01-15 DIAGNOSIS — F17210 Nicotine dependence, cigarettes, uncomplicated: Secondary | ICD-10-CM | POA: Diagnosis not present

## 2017-01-15 DIAGNOSIS — M5010 Cervical disc disorder with radiculopathy, unspecified cervical region: Secondary | ICD-10-CM | POA: Diagnosis not present

## 2017-01-15 DIAGNOSIS — G43719 Chronic migraine without aura, intractable, without status migrainosus: Secondary | ICD-10-CM | POA: Diagnosis not present

## 2017-01-15 DIAGNOSIS — M5116 Intervertebral disc disorders with radiculopathy, lumbar region: Secondary | ICD-10-CM | POA: Diagnosis not present

## 2017-01-15 DIAGNOSIS — I349 Nonrheumatic mitral valve disorder, unspecified: Secondary | ICD-10-CM | POA: Diagnosis not present

## 2017-01-15 DIAGNOSIS — M797 Fibromyalgia: Secondary | ICD-10-CM | POA: Diagnosis not present

## 2017-01-15 DIAGNOSIS — M545 Low back pain: Secondary | ICD-10-CM | POA: Diagnosis not present

## 2017-01-16 ENCOUNTER — Telehealth: Payer: Self-pay

## 2017-01-16 NOTE — Telephone Encounter (Signed)
Pt called office. She has not received lab orders in the mail. Her address has changed. Updated address in Epic. Lab orders mailed to new address.

## 2017-01-23 ENCOUNTER — Encounter: Payer: Self-pay | Admitting: Gastroenterology

## 2017-01-24 ENCOUNTER — Telehealth: Payer: Self-pay

## 2017-01-24 ENCOUNTER — Encounter: Payer: Self-pay | Admitting: Gastroenterology

## 2017-01-24 NOTE — Telephone Encounter (Signed)
Pt left Vm yesterday ( I was doing pt care) and did not get to return her call until today. She was asking when she needs to do her labs. See documentation, from 12/25/2016 she was first to do labs in 2 weeks. Then Lubertha Sayres, NP added labs and said for her to wait 4 weeks. So she may do labs at anytime. She wants to reschedule procedure and she was scheduled in OR. I have left Vm for a return call.

## 2017-01-24 NOTE — Telephone Encounter (Signed)
Needs office visit. I am sending her a response in MyChart. Please arrange OV with me, non-urgent.

## 2017-01-24 NOTE — Telephone Encounter (Signed)
She has new house and cell number. House phone number is [phone] and cell number is [phone].

## 2017-01-24 NOTE — Telephone Encounter (Signed)
APPT MADE AND LETTER SENT  °

## 2017-01-24 NOTE — Telephone Encounter (Signed)
Forwarding to [place] to schedule OV with Tobi Bastos, non urgent.

## 2017-01-24 NOTE — Telephone Encounter (Signed)
Pt is aware to do labs anytime now. She is aware of the OV appt with AB that Misty Stanley just mailed letter for.

## 2017-01-31 DIAGNOSIS — R109 Unspecified abdominal pain: Secondary | ICD-10-CM | POA: Diagnosis not present

## 2017-01-31 DIAGNOSIS — R197 Diarrhea, unspecified: Secondary | ICD-10-CM | POA: Diagnosis not present

## 2017-01-31 DIAGNOSIS — R1111 Vomiting without nausea: Secondary | ICD-10-CM | POA: Diagnosis not present

## 2017-01-31 LAB — CBC WITH DIFFERENTIAL/PLATELET
Basophils Absolute: 0 cells/uL (ref 0–200)
Basophils Relative: 0 %
EOS PCT: 1 %
Eosinophils Absolute: 74 cells/uL (ref 15–500)
HCT: 40.4 % (ref 35.0–45.0)
Hemoglobin: 13.3 g/dL (ref 11.7–15.5)
LYMPHS ABS: 1110 {cells}/uL (ref 850–3900)
Lymphocytes Relative: 15 %
MCH: 34.3 pg — AB (ref 27.0–33.0)
MCHC: 32.9 g/dL (ref 32.0–36.0)
MCV: 104.1 fL — ABNORMAL HIGH (ref 80.0–100.0)
MPV: 10.1 fL (ref 7.5–12.5)
Monocytes Absolute: 370 cells/uL (ref 200–950)
Monocytes Relative: 5 %
NEUTROS ABS: 5846 {cells}/uL (ref 1500–7800)
Neutrophils Relative %: 79 %
Platelets: 319 10*3/uL (ref 140–400)
RBC: 3.88 MIL/uL (ref 3.80–5.10)
RDW: 12.7 % (ref 11.0–15.0)
WBC: 7.4 10*3/uL (ref 3.8–10.8)

## 2017-02-01 LAB — FOLATE: FOLATE: 6.6 ng/mL (ref >5.4)

## 2017-02-01 LAB — VITAMIN B12: VITAMIN B 12: 740 pg/mL (ref 200–1100)

## 2017-02-01 LAB — HOMOCYSTEINE: Homocysteine: 22 umol/L — ABNORMAL HIGH (ref <10.4)

## 2017-02-02 LAB — METHYLMALONIC ACID, SERUM: Methylmalonic Acid, Quant: 150 nmol/L (ref 87–318)

## 2017-02-05 ENCOUNTER — Telehealth: Payer: Self-pay

## 2017-02-05 ENCOUNTER — Encounter: Payer: Self-pay | Admitting: Gastroenterology

## 2017-02-05 NOTE — Telephone Encounter (Signed)
Noted and called pt.

## 2017-02-05 NOTE — Telephone Encounter (Signed)
Pt has been checking MY CHART for lab results.

## 2017-02-05 NOTE — Telephone Encounter (Signed)
Pt's appt is on 02/26/2017 with Lewie Loron, NP.

## 2017-02-05 NOTE — Telephone Encounter (Signed)
I added a result note. I am reviewing with hematology.

## 2017-02-05 NOTE — Progress Notes (Signed)
Folate deficiency resolved. I am reviewing labs with hematology and will let her know as soon as I can. MCV increased. We may need a hematology referral, but I am going to talk with them first about next steps.

## 2017-02-05 NOTE — Progress Notes (Signed)
Pt is aware.  

## 2017-02-06 NOTE — Progress Notes (Signed)
I would like to update this: I reviewed further labs to include the methylmalonic acid (which is normal), but the elevated homocysteine level means she STILL actually does have folate deficiency. Needs to continue the folate 1 mg daily. We need to recheck CBC with diff, , B12, Folate, methylmalonic acid, and homocysteine in 3 months.

## 2017-02-07 NOTE — Progress Notes (Signed)
LMOM to call.

## 2017-02-11 ENCOUNTER — Other Ambulatory Visit: Payer: Self-pay

## 2017-02-11 DIAGNOSIS — R1010 Upper abdominal pain, unspecified: Secondary | ICD-10-CM

## 2017-02-11 DIAGNOSIS — R197 Diarrhea, unspecified: Secondary | ICD-10-CM

## 2017-02-11 NOTE — Progress Notes (Signed)
PT is aware of results and plan.  

## 2017-02-11 NOTE — Progress Notes (Signed)
[place] on file for 05/13/2017.

## 2017-02-13 DIAGNOSIS — K588 Other irritable bowel syndrome: Secondary | ICD-10-CM | POA: Diagnosis not present

## 2017-02-13 DIAGNOSIS — M5010 Cervical disc disorder with radiculopathy, unspecified cervical region: Secondary | ICD-10-CM | POA: Diagnosis not present

## 2017-02-13 DIAGNOSIS — M5116 Intervertebral disc disorders with radiculopathy, lumbar region: Secondary | ICD-10-CM | POA: Diagnosis not present

## 2017-02-13 DIAGNOSIS — Z79891 Long term (current) use of opiate analgesic: Secondary | ICD-10-CM | POA: Diagnosis not present

## 2017-02-13 DIAGNOSIS — G43719 Chronic migraine without aura, intractable, without status migrainosus: Secondary | ICD-10-CM | POA: Diagnosis not present

## 2017-02-13 DIAGNOSIS — M797 Fibromyalgia: Secondary | ICD-10-CM | POA: Diagnosis not present

## 2017-02-13 DIAGNOSIS — I349 Nonrheumatic mitral valve disorder, unspecified: Secondary | ICD-10-CM | POA: Diagnosis not present

## 2017-02-13 DIAGNOSIS — F331 Major depressive disorder, recurrent, moderate: Secondary | ICD-10-CM | POA: Diagnosis not present

## 2017-02-13 DIAGNOSIS — F17210 Nicotine dependence, cigarettes, uncomplicated: Secondary | ICD-10-CM | POA: Diagnosis not present

## 2017-02-13 DIAGNOSIS — M545 Low back pain: Secondary | ICD-10-CM | POA: Diagnosis not present

## 2017-02-26 ENCOUNTER — Encounter: Payer: Self-pay | Admitting: Gastroenterology

## 2017-02-26 ENCOUNTER — Ambulatory Visit (INDEPENDENT_AMBULATORY_CARE_PROVIDER_SITE_OTHER): Payer: PPO | Admitting: Gastroenterology

## 2017-02-26 ENCOUNTER — Other Ambulatory Visit: Payer: Self-pay

## 2017-02-26 VITALS — BP 123/88 | HR 104 | Temp 97.8°F | Ht 65.0 in | Wt 153.0 lb

## 2017-02-26 DIAGNOSIS — R1010 Upper abdominal pain, unspecified: Secondary | ICD-10-CM

## 2017-02-26 DIAGNOSIS — R1310 Dysphagia, unspecified: Secondary | ICD-10-CM

## 2017-02-26 DIAGNOSIS — R197 Diarrhea, unspecified: Secondary | ICD-10-CM

## 2017-02-26 DIAGNOSIS — D7589 Other specified diseases of blood and blood-forming organs: Secondary | ICD-10-CM | POA: Insufficient documentation

## 2017-02-26 MED ORDER — PEG-KCL-NACL-NASULF-NA ASC-C 100 G PO SOLR: 1.0000 | ORAL | 0 refills | Status: DC

## 2017-02-26 NOTE — Assessment & Plan Note (Addendum)
46 year old female with chronic, postprandial loose stool, likely IBS. No significant improvement with Bentyl historically. Not a candidate for Viberzi due to absent gallbladder. Interestingly, she notes constipation about every 3-4 months, with quick return to diarrhea. On chronic narcotics. Believes she had a colonoscopy in the remote past, but no reports were available. Celiac serologies negative. Hgb normal.  Proceed with colonoscopy with Dr. Darrick PennaFields in the near future. The risks, benefits, and alternatives have been discussed in detail with the patient. They state understanding and desire to proceed.  PROPOFOL due to polypharmacy Return in 3 months.

## 2017-02-26 NOTE — Assessment & Plan Note (Addendum)
Chronic, but nausea and vomiting improved with PPI. Likely gastritis/GERD. No dysphagia. Gallbladder absent and LFTs normal.  Proceed with upper endoscopy in the near future with Dr. Darrick PennaFields. The risks, benefits, and alternatives have been discussed in detail with patient. They have stated understanding and desire to proceed.  PROPOFOL due to polypharmacy Continue Protonix once daily.  Return in 3 months.

## 2017-02-26 NOTE — Progress Notes (Signed)
Referring Provider: Celedonio Savage, MD Primary Care Physician:  Celedonio Savage, MD Primary GI: Dr. Oneida Alar   Chief Complaint  Patient presents with  . Constipation    schedule procedures    HPI:   Cynthia Finley is a 46 y.o. female presenting today with a history of postprandial loose stool, abdominal pain, N/V. Initially seen in Feb 2018 as new patient but had to postpone procedures due to husband's illness. Gallbladder absent. Placed on PPI at last visit with plans for EGD. Due to chronic diarrhea and no improvement with supportive measures, colonoscopy planned as well. Recommended discontinuing large amounts of [facility] she normally consumes. Labs: celiac serologies negative, LFTs normal, CBC noted macrocytosis. Found to have folate deficiency. B12 normal. Placed on folate supplementation. Recheck of labs show improved folate to normal range; however, elevated homocysteine level noted, indicating persistent folate deficiency. Methylmalonic acid normal (April 2018). She will continue folate 1 mg daily and recheck CBC, B12, folate, methylmalonic acid, and homocysteine in 3 months.   Started having constipation Friday or Saturday. Used a suppository, 4 women's laxatives, drank coffee, Miralax, and then finally had a BM.  No rectal bleeding. Constipation occurs every 3-4 months for a few days, then back to chronic diarrhea.   Still with intermittent epigastric pain. Unrelated to eating/drinking. Nausea improved. If eats, will "swell up like I am 9 months pregnant". No dysphagia. Taking Protonix once daily.   Past Medical History:  Diagnosis Date  . Anxiety   . Chronic back pain   . Depression   . GERD (gastroesophageal reflux disease)   . IBS (irritable bowel syndrome)   . Insomnia   . Migraine   . Mitral valve prolapse     Past Surgical History:  Procedure Laterality Date  . BACK SURGERY     X 6  . CHOLECYSTECTOMY    . DILATION AND CURETTAGE OF UTERUS    . TONSILLECTOMY       Current Outpatient Prescriptions  Medication Sig Dispense Refill  . cyclobenzaprine (FLEXERIL) 10 MG tablet Take 10 mg by mouth 3 (three) times daily as needed for muscle spasms.     . folic acid (FOLVITE) 761 MCG tablet Take 400 mcg by mouth daily.    . furosemide (LASIX) 20 MG tablet Take 20 mg by mouth daily.    . montelukast (SINGULAIR) 10 MG tablet Take 10 mg by mouth daily.     . ondansetron (ZOFRAN) 4 MG tablet Take 1 tablet (4 mg total) by mouth 4 (four) times daily -  before meals and at bedtime. (Patient taking differently: Take 4 mg by mouth as needed. ) 120 tablet 3  . oxyCODONE-acetaminophen (PERCOCET) 7.5-325 MG tablet Take 1 tablet by mouth every 6 (six) hours.     . pantoprazole (PROTONIX) 40 MG tablet Take 1 tablet (40 mg total) by mouth daily. Take 30 minutes before breakfast daily. 90 tablet 3  . potassium chloride (K-DUR) 10 MEQ tablet Take 10 mEq by mouth daily.    Marland Kitchen zolpidem (AMBIEN) 5 MG tablet Take 5 mg by mouth at bedtime as needed for sleep.     . peg 3350 powder (MOVIPREP) 100 g SOLR Take 1 kit (200 g total) by mouth as directed. 1 kit 0   No current facility-administered medications for this visit.     Allergies as of 02/26/2017 - Review Complete 02/26/2017  Allergen Reaction Noted  . Aspirin  11/23/2016  . Tape Rash 11/23/2016    Family History  Problem Relation Age of Onset  . Colon cancer Neg Hx   . Colon polyps Neg Hx     Social History   Social History  . Marital status: Married    Spouse name: N/A  . Number of children: N/A  . Years of education: N/A   Occupational History  . disability    Social History Main Topics  . Smoking status: Current Every Day Smoker    Packs/day: 1.00    Years: 23.00  . Smokeless tobacco: Never Used  . Alcohol use No  . Drug use: No  . Sexual activity: Yes    Birth control/ protection: Surgical   Other Topics Concern  . None   Social History Narrative  . None    Review of Systems: Gen: Denies  fever, chills, anorexia. Denies fatigue, weakness, weight loss.  CV: Denies chest pain, palpitations, syncope, peripheral edema, and claudication. Resp: Denies dyspnea at rest, cough, wheezing, coughing up blood, and pleurisy. GI: see HPI  Derm: Denies rash, itching, dry skin Psych: Denies depression, anxiety, memory loss, confusion. No homicidal or suicidal ideation.  Heme: Denies bruising, bleeding, and enlarged lymph nodes.  Physical Exam: BP 123/88   Pulse (!) 104   Temp 97.8 F (36.6 C) (Oral)   Ht _0  (1.651 m)   Wt 153 lb (69.4 kg)   BMI 25.46 kg/m  General:   Alert and oriented. No distress noted. Pleasant and cooperative.  Head:  Normocephalic and atraumatic. Eyes:  Conjuctiva clear without scleral icterus. Mouth:  Oral mucosa pink and moist. Poor dentition  Heart:  S1, S2 present without murmurs, rubs, or gallops. Regular rate and rhythm. Abdomen:  +BS, soft, non-tender and non-distended. No rebound or guarding. No HSM or masses noted. Msk:  Symmetrical without gross deformities. Normal posture. Extremities:  Without edema. Neurologic:  Alert and  oriented x4;  grossly normal neurologically. Psych:  Alert and cooperative. Normal mood and affect.  Lab Results  Component Value Date   WBC 7.4 01/31/2017   HGB 13.3 01/31/2017   HCT 40.4 01/31/2017   MCV 104.1 (H) 01/31/2017   PLT 319 01/31/2017   Lab Results  Component Value Date   ALT 9 11/23/2016   AST 14 11/23/2016   ALKPHOS 70 11/23/2016   BILITOT 0.3 11/23/2016   Lab Results  Component Value Date   CREATININE 0.64 12/13/2016   BUN 6 12/13/2016   NA 138 12/13/2016   K 3.5 12/13/2016   CL 103 12/13/2016   CO2 28 12/13/2016   Lab Results  Component Value Date   TSH 3.54 12/17/2016   Lab Results  Component Value Date   VITAMINB12 740 01/31/2017

## 2017-02-26 NOTE — Assessment & Plan Note (Signed)
Labs showing folate deficiency, normal B12, does not drink. TSH normal. Continue folic acid supplementation and recheck  CBC, B12, folate, methylmalonic acid, and homocysteine in 3 months. If no improvement in 3 months, refer to hematology. As of note, I reviewed this case briefly with hematology colleague for completeness' sake.

## 2017-02-26 NOTE — Patient Instructions (Signed)
We have scheduled you for a colonoscopy and upper endoscopy with Dr. Darrick PennaFields.  I will see you in 3 months!

## 2017-02-27 NOTE — Progress Notes (Signed)
cc'ed to pcp °

## 2017-03-01 DIAGNOSIS — K588 Other irritable bowel syndrome: Secondary | ICD-10-CM | POA: Diagnosis not present

## 2017-03-01 DIAGNOSIS — M5416 Radiculopathy, lumbar region: Secondary | ICD-10-CM | POA: Diagnosis not present

## 2017-03-01 DIAGNOSIS — G43719 Chronic migraine without aura, intractable, without status migrainosus: Secondary | ICD-10-CM | POA: Diagnosis not present

## 2017-03-01 DIAGNOSIS — I349 Nonrheumatic mitral valve disorder, unspecified: Secondary | ICD-10-CM | POA: Diagnosis not present

## 2017-03-01 DIAGNOSIS — M545 Low back pain: Secondary | ICD-10-CM | POA: Diagnosis not present

## 2017-03-01 DIAGNOSIS — M5116 Intervertebral disc disorders with radiculopathy, lumbar region: Secondary | ICD-10-CM | POA: Diagnosis not present

## 2017-03-01 DIAGNOSIS — F331 Major depressive disorder, recurrent, moderate: Secondary | ICD-10-CM | POA: Diagnosis not present

## 2017-03-01 DIAGNOSIS — M5010 Cervical disc disorder with radiculopathy, unspecified cervical region: Secondary | ICD-10-CM | POA: Diagnosis not present

## 2017-03-01 DIAGNOSIS — Z79891 Long term (current) use of opiate analgesic: Secondary | ICD-10-CM | POA: Diagnosis not present

## 2017-03-01 DIAGNOSIS — F17210 Nicotine dependence, cigarettes, uncomplicated: Secondary | ICD-10-CM | POA: Diagnosis not present

## 2017-03-01 DIAGNOSIS — M797 Fibromyalgia: Secondary | ICD-10-CM | POA: Diagnosis not present

## 2017-03-13 DIAGNOSIS — R0602 Shortness of breath: Secondary | ICD-10-CM | POA: Diagnosis not present

## 2017-03-13 DIAGNOSIS — R002 Palpitations: Secondary | ICD-10-CM | POA: Diagnosis not present

## 2017-03-13 DIAGNOSIS — T887XXA Unspecified adverse effect of drug or medicament, initial encounter: Secondary | ICD-10-CM | POA: Diagnosis not present

## 2017-03-13 DIAGNOSIS — F17200 Nicotine dependence, unspecified, uncomplicated: Secondary | ICD-10-CM | POA: Diagnosis not present

## 2017-03-13 DIAGNOSIS — Z79899 Other long term (current) drug therapy: Secondary | ICD-10-CM | POA: Diagnosis not present

## 2017-03-13 DIAGNOSIS — T43015A Adverse effect of tricyclic antidepressants, initial encounter: Secondary | ICD-10-CM | POA: Diagnosis not present

## 2017-03-13 DIAGNOSIS — R000 Tachycardia, unspecified: Secondary | ICD-10-CM | POA: Diagnosis not present

## 2017-03-13 DIAGNOSIS — M549 Dorsalgia, unspecified: Secondary | ICD-10-CM | POA: Diagnosis not present

## 2017-03-13 DIAGNOSIS — G8929 Other chronic pain: Secondary | ICD-10-CM | POA: Diagnosis not present

## 2017-03-15 DIAGNOSIS — F331 Major depressive disorder, recurrent, moderate: Secondary | ICD-10-CM | POA: Diagnosis not present

## 2017-03-15 DIAGNOSIS — I349 Nonrheumatic mitral valve disorder, unspecified: Secondary | ICD-10-CM | POA: Diagnosis not present

## 2017-03-15 DIAGNOSIS — G43719 Chronic migraine without aura, intractable, without status migrainosus: Secondary | ICD-10-CM | POA: Diagnosis not present

## 2017-03-15 DIAGNOSIS — Z79891 Long term (current) use of opiate analgesic: Secondary | ICD-10-CM | POA: Diagnosis not present

## 2017-03-15 DIAGNOSIS — M5416 Radiculopathy, lumbar region: Secondary | ICD-10-CM | POA: Diagnosis not present

## 2017-03-15 DIAGNOSIS — M5116 Intervertebral disc disorders with radiculopathy, lumbar region: Secondary | ICD-10-CM | POA: Diagnosis not present

## 2017-03-15 DIAGNOSIS — M5010 Cervical disc disorder with radiculopathy, unspecified cervical region: Secondary | ICD-10-CM | POA: Diagnosis not present

## 2017-03-15 DIAGNOSIS — M545 Low back pain: Secondary | ICD-10-CM | POA: Diagnosis not present

## 2017-03-15 DIAGNOSIS — F17210 Nicotine dependence, cigarettes, uncomplicated: Secondary | ICD-10-CM | POA: Diagnosis not present

## 2017-03-15 DIAGNOSIS — M797 Fibromyalgia: Secondary | ICD-10-CM | POA: Diagnosis not present

## 2017-03-15 DIAGNOSIS — K588 Other irritable bowel syndrome: Secondary | ICD-10-CM | POA: Diagnosis not present

## 2017-03-17 ENCOUNTER — Encounter: Payer: Self-pay | Admitting: Gastroenterology

## 2017-03-19 ENCOUNTER — Encounter: Payer: Self-pay | Admitting: Gastroenterology

## 2017-03-19 NOTE — Patient Instructions (Addendum)
Cynthia Finley  03/19/2017     @PREFPERIOPPHARMACY @   Your procedure is scheduled on 03/26/2017.  Report to [facility] at 11:15 [facility].M.  Call this number if you have problems the morning of surgery:  [phone]   Remember:  Do not eat food or drink liquids after midnight.  Take these medicines the morning of surgery with A SIP OF WATER : Flexeril, Prozac, Neurontin, Singulair, Zofran, Percocet and Protonix   Do not wear jewelry, make-up or nail polish.  Do not wear lotions, powders, or perfumes, or deoderant.  Do not shave 48 hours prior to surgery.  Men may shave face and neck.  Do not bring valuables to the hospital.  [facility] is not responsible for any belongings or valuables.  Contacts, dentures or bridgework may not be worn into surgery.  Leave your suitcase in the car.  After surgery it may be brought to your room.  For patients admitted to the hospital, discharge time will be determined by your treatment team.  Patients discharged the day of surgery will not be allowed to drive home.   Name and phone number of your driver:   family Special instructions:  n/a  Please read over the following fact sheets that you were given. Care and Recovery After Surgery       Esophagogastroduodenoscopy Esophagogastroduodenoscopy (EGD) is a procedure to examine the lining of the esophagus, stomach, and first part of the small intestine (duodenum). This procedure is done to check for problems such as inflammation, bleeding, ulcers, or growths. During this procedure, a long, flexible, lighted tube with a camera attached (endoscope) is inserted down the throat. Tell a health care provider about:  Any allergies you have.  All medicines you are taking, including vitamins, herbs, eye drops, creams, and over-the-counter medicines.  Any problems you or family members have had with anesthetic medicines.  Any blood disorders you have.  Any surgeries you have had.  Any medical  conditions you have.  Whether you are pregnant or may be pregnant. What are the risks? Generally, this is a safe procedure. However, problems may occur, including:  Infection.  Bleeding.  A tear (perforation) in the esophagus, stomach, or duodenum.  Trouble breathing.  Excessive sweating.  Spasms of the larynx.  A slowed heartbeat.  Low blood pressure. What happens before the procedure?  Follow instructions from your health care provider about eating or drinking restrictions.  Ask your health care provider about:  Changing or stopping your regular medicines. This is especially important if you are taking diabetes medicines or blood thinners.  Taking medicines such as aspirin and ibuprofen. These medicines can thin your blood. Do not take these medicines before your procedure if your health care provider instructs you not to.  Plan to have someone take you home after the procedure.  If you wear dentures, be ready to remove them before the procedure. What happens during the procedure?  To reduce your risk of infection, your health care team will wash or sanitize their hands.  An IV tube will be put in a vein in your hand or arm. You will get medicines and fluids through this tube.  You will be given one or more of the following:  A medicine to help you relax (sedative).  A medicine to numb the area (local anesthetic). This medicine may be sprayed into your throat. It will make you feel more comfortable and keep you from gagging or coughing during the procedure.  A medicine for  pain.  A mouth guard may be placed in your mouth to protect your teeth and to keep you from biting on the endoscope.  You will be asked to lie on your left side.  The endoscope will be lowered down your throat into your esophagus, stomach, and duodenum.  Air will be put into the endoscope. This will help your health care provider see better.  The lining of your esophagus, stomach, and  duodenum will be examined.  Your health care provider may:  Take a tissue sample so it can be looked at in a lab (biopsy).  Remove growths.  Remove objects (foreign bodies) that are stuck.  Treat any bleeding with medicines or other devices that stop tissue from bleeding.  Widen (dilate) or stretch narrowed areas of your esophagus and stomach.  The endoscope will be taken out. The procedure may vary among health care providers and hospitals. What happens after the procedure?  Your blood pressure, heart rate, breathing rate, and blood oxygen level will be monitored often until the medicines you were given have worn off.  Do not eat or drink anything until the numbing medicine has worn off and your gag reflex has returned. This information is not intended to replace advice given to you by your health care provider. Make sure you discuss any questions you have with your health care provider. Document Released: 02/08/2005 Document Revised: 03/15/2016 Document Reviewed: 09/01/2015 Elsevier Interactive Patient Education  2017 [employer] Inc[employer]  Colonoscopy, Adult, Care After This sheet gives you information about how to care for yourself after your procedure. Your health care provider may also give you more specific instructions. If you have problems or questions, contact your health care provider. What can I expect after the procedure? After the procedure, it is common to have:  A small amount of blood in your stool for 24 hours after the procedure.  Some gas.  Mild abdominal cramping or bloating. Follow these instructions at home: General instructions    For the first 24 hours after the procedure:  Do not drive or use machinery.  Do not sign important documents.  Do not drink alcohol.  Do your regular daily activities at a slower pace than normal.  Eat soft, easy-to-digest foods.  Rest often.  Take over-the-counter or prescription medicines only as told by your health  care provider.  It is up to you to get the results of your procedure. Ask your health care provider, or the department performing the procedure, when your results will be ready. Relieving cramping and bloating   Try walking around when you have cramps or feel bloated.  Apply heat to your abdomen as told by your health care provider. Use a heat source that your health care provider recommends, such as a moist heat pack or a heating pad.  Place a towel between your skin and the heat source.  Leave the heat on for 20-30 minutes.  Remove the heat if your skin turns bright red. This is especially important if you are unable to feel pain, heat, or cold. You may have a greater risk of getting burned. Eating and drinking   Drink enough fluid to keep your urine clear or pale yellow.  Resume your normal diet as instructed by your health care provider. Avoid heavy or fried foods that are hard to digest.  Avoid drinking alcohol for as long as instructed by your health care provider. Contact a health care provider if:  You have blood in your stool 2-3 days  after the procedure. Get help right away if:  You have more than a small spotting of blood in your stool.  You pass large blood clots in your stool.  Your abdomen is swollen.  You have nausea or vomiting.  You have a fever.  You have increasing abdominal pain that is not relieved with medicine. This information is not intended to replace advice given to you by your health care provider. Make sure you discuss any questions you have with your health care provider. Document Released: 05/22/2004 Document Revised: 07/02/2016 Document Reviewed: 12/20/2015 Elsevier Interactive Patient Education  2017 [employer]

## 2017-03-21 ENCOUNTER — Encounter (HOSPITAL_COMMUNITY)
Admission: RE | Admit: 2017-03-21 | Discharge: 2017-03-21 | Disposition: A | Discharge status: Home / Self Care | Payer: PPO | Source: Ambulatory Visit | Attending: Gastroenterology | Admitting: Gastroenterology

## 2017-03-21 ENCOUNTER — Encounter (HOSPITAL_COMMUNITY): Payer: Self-pay

## 2017-03-22 ENCOUNTER — Telehealth: Payer: Self-pay

## 2017-03-22 NOTE — Telephone Encounter (Signed)
Noted  

## 2017-03-22 NOTE — Telephone Encounter (Signed)
Endo Scheduler called office and said that pt didn't show for her pre-op appt. She told nurse when they called her that she is sick with bronchitis and wasn't going to have procedure done. She said she was going to call our office, however we haven't heard form her. Tried to call pt, no answer, LMOVM and informed pt that she will need an OV to reschedule procedure.   Routing to AB as FYI.

## 2017-03-26 ENCOUNTER — Encounter (HOSPITAL_COMMUNITY): Admission: RE | Payer: Self-pay | Source: Ambulatory Visit

## 2017-03-26 ENCOUNTER — Ambulatory Visit (HOSPITAL_COMMUNITY): Admission: RE | Admit: 2017-03-26 | Payer: PPO | Source: Ambulatory Visit | Admitting: Gastroenterology

## 2017-03-26 SURGERY — COLONOSCOPY WITH PROPOFOL
Anesthesia: Monitor Anesthesia Care

## 2017-03-28 ENCOUNTER — Other Ambulatory Visit: Payer: Self-pay

## 2017-03-28 DIAGNOSIS — R1010 Upper abdominal pain, unspecified: Secondary | ICD-10-CM

## 2017-03-28 DIAGNOSIS — R197 Diarrhea, unspecified: Secondary | ICD-10-CM

## 2017-04-10 DIAGNOSIS — I349 Nonrheumatic mitral valve disorder, unspecified: Secondary | ICD-10-CM | POA: Diagnosis not present

## 2017-04-10 DIAGNOSIS — Z79891 Long term (current) use of opiate analgesic: Secondary | ICD-10-CM | POA: Diagnosis not present

## 2017-04-10 DIAGNOSIS — G43719 Chronic migraine without aura, intractable, without status migrainosus: Secondary | ICD-10-CM | POA: Diagnosis not present

## 2017-04-10 DIAGNOSIS — M797 Fibromyalgia: Secondary | ICD-10-CM | POA: Diagnosis not present

## 2017-04-10 DIAGNOSIS — F17210 Nicotine dependence, cigarettes, uncomplicated: Secondary | ICD-10-CM | POA: Diagnosis not present

## 2017-04-10 DIAGNOSIS — K588 Other irritable bowel syndrome: Secondary | ICD-10-CM | POA: Diagnosis not present

## 2017-04-10 DIAGNOSIS — M5416 Radiculopathy, lumbar region: Secondary | ICD-10-CM | POA: Diagnosis not present

## 2017-04-10 DIAGNOSIS — M5010 Cervical disc disorder with radiculopathy, unspecified cervical region: Secondary | ICD-10-CM | POA: Diagnosis not present

## 2017-04-10 DIAGNOSIS — M545 Low back pain: Secondary | ICD-10-CM | POA: Diagnosis not present

## 2017-04-10 DIAGNOSIS — F331 Major depressive disorder, recurrent, moderate: Secondary | ICD-10-CM | POA: Diagnosis not present

## 2017-04-10 DIAGNOSIS — M5116 Intervertebral disc disorders with radiculopathy, lumbar region: Secondary | ICD-10-CM | POA: Diagnosis not present

## 2017-05-13 DIAGNOSIS — R1010 Upper abdominal pain, unspecified: Secondary | ICD-10-CM | POA: Diagnosis not present

## 2017-05-13 DIAGNOSIS — R197 Diarrhea, unspecified: Secondary | ICD-10-CM | POA: Diagnosis not present

## 2017-05-13 LAB — CBC WITH DIFFERENTIAL/PLATELET
Basophils Absolute: 0 cells/uL (ref 0–200)
Basophils Relative: 0 %
EOS ABS: 146 {cells}/uL (ref 15–500)
Eosinophils Relative: 2 %
HEMATOCRIT: 36.2 % (ref 35.0–45.0)
Hemoglobin: 11.7 g/dL (ref 11.7–15.5)
Lymphocytes Relative: 20 %
Lymphs Abs: 1460 cells/uL (ref 850–3900)
MCH: 33.3 pg — ABNORMAL HIGH (ref 27.0–33.0)
MCHC: 32.3 g/dL (ref 32.0–36.0)
MCV: 103.1 fL — AB (ref 80.0–100.0)
MONO ABS: 511 {cells}/uL (ref 200–950)
MONOS PCT: 7 %
MPV: 10.8 fL (ref 7.5–12.5)
NEUTROS PCT: 71 %
Neutro Abs: 5183 cells/uL (ref 1500–7800)
Platelets: 254 10*3/uL (ref 140–400)
RBC: 3.51 MIL/uL — ABNORMAL LOW (ref 3.80–5.10)
RDW: 13.7 % (ref 11.0–15.0)
WBC: 7.3 10*3/uL (ref 3.8–10.8)

## 2017-05-14 LAB — HOMOCYSTEINE: Homocysteine: 7.5 umol/L (ref <10.4)

## 2017-05-14 LAB — FOLATE: Folate: 17.4 ng/mL (ref >5.4)

## 2017-05-14 LAB — VITAMIN B12: Vitamin B-12: 517 pg/mL (ref 200–1100)

## 2017-05-16 LAB — METHYLMALONIC ACID, SERUM: Methylmalonic Acid, Quant: 238 nmol/L (ref 87–318)

## 2017-05-22 NOTE — Progress Notes (Signed)
Homocysteine level normal now. B12 normal. Folate improved. Still with macrocytosis. We need to refer to Hematology now as she needs further evaluation. I sent her a message in [facility]. Please refer to Hematology due to chronic macrocytosis in setting of folate deficiency, s/p folate supplementation without improvement.

## 2017-05-23 ENCOUNTER — Other Ambulatory Visit: Payer: Self-pay

## 2017-05-23 DIAGNOSIS — D7589 Other specified diseases of blood and blood-forming organs: Secondary | ICD-10-CM

## 2017-05-29 ENCOUNTER — Ambulatory Visit (INDEPENDENT_AMBULATORY_CARE_PROVIDER_SITE_OTHER): Payer: PPO | Admitting: Gastroenterology

## 2017-05-29 VITALS — BP 125/88 | HR 102 | Temp 97.5°F | Ht 65.0 in | Wt 167.6 lb

## 2017-05-29 DIAGNOSIS — R600 Localized edema: Secondary | ICD-10-CM

## 2017-05-29 DIAGNOSIS — D7589 Other specified diseases of blood and blood-forming organs: Secondary | ICD-10-CM | POA: Diagnosis not present

## 2017-05-29 DIAGNOSIS — R1030 Lower abdominal pain, unspecified: Secondary | ICD-10-CM | POA: Insufficient documentation

## 2017-05-29 DIAGNOSIS — K5900 Constipation, unspecified: Secondary | ICD-10-CM | POA: Insufficient documentation

## 2017-05-29 DIAGNOSIS — K625 Hemorrhage of anus and rectum: Secondary | ICD-10-CM | POA: Diagnosis not present

## 2017-05-29 NOTE — Progress Notes (Signed)
Referring Provider: Celedonio Savage, MD Primary Care Physician:  Celedonio Savage, MD Primary GI: Dr. Oneida Alar   Chief Complaint  Patient presents with  . Constipation    HPI:   Cynthia Finley is a 46 y.o. female presenting today with a history of IBS, abdominal pain, N/V.  Initially seen in Feb 2018 as new patient but had to postpone procedures due to husband's illness. Gallbladder absent. Placed on PPI at last visit with plans for EGD. Due to chronic diarrhea and no improvement with supportive measures, colonoscopy planned as well. Recommended discontinuing large amounts of [facility] she normally consumes. Labs: celiac serologies negative, LFTs normal, CBC noted macrocytosis. Found to have folate deficiency. B12 normal. Placed on folate supplementation. Recheck of labs show improved folate to normal range; however, elevated homocysteine level noted, indicating persistent folate deficiency. Methylmalonic acid normal (April 2018). Repeat labs with normal homocysteine level, B12 normal, improved folate. Still with macrocytosis. Referred to Hematology for chronic macrocytosis in setting of folate deficiency, s/p folate supplementation without improvement in macrocytosis.    Feeling constipated. On oxycodone regularly. BMs are not productive, small amounts. Saw some blood in stool today after large BM. Had to strain. Has been on stool softener. Feels bloated. Abdominal pain in lower abdomen. Stays bloated. As soon as she eats, feels like she blows up like a watermelon. Drinking coffee to try and help with constipation. N/V resolved. No dysphagia. Appetite is good. Having swelling in lower legs. Down from 61 mountain dews a day to 6 per husband. Leg swelling more frequently now, extending to thigh. Vague lower abdominal discomfort and significant bloating.    Past Medical History:  Diagnosis Date  . Anxiety   . Chronic back pain   . Depression   . GERD (gastroesophageal reflux disease)   . IBS  (irritable bowel syndrome)   . Insomnia   . Migraine   . Mitral valve prolapse     Past Surgical History:  Procedure Laterality Date  . BACK SURGERY     X 6  . CHOLECYSTECTOMY    . DILATION AND CURETTAGE OF UTERUS    . TONSILLECTOMY      Current Outpatient Prescriptions  Medication Sig Dispense Refill  . calcium carbonate (OSCAL) 1500 (600 Ca) MG TABS tablet Take 600 mg by mouth daily.    . cyclobenzaprine (FLEXERIL) 10 MG tablet Take 10 mg by mouth 3 (three) times daily as needed for muscle spasms.     Marland Kitchen FLUoxetine (PROZAC) 40 MG capsule Take 40 mg by mouth daily.    . folic acid (FOLVITE) 177 MCG tablet Take 400 mcg by mouth daily.    Marland Kitchen gabapentin (NEURONTIN) 400 MG capsule Take 400-800 mg by mouth 2 (two) times daily. Take 1 capsules 400 mg in the morning & 2 capsules (800 mg) at night    . ibuprofen (ADVIL,MOTRIN) 600 MG tablet Take 600 mg by mouth 2 (two) times daily as needed for pain.    . montelukast (SINGULAIR) 10 MG tablet Take 10 mg by mouth daily.     . ondansetron (ZOFRAN) 4 MG tablet Take 1 tablet (4 mg total) by mouth 4 (four) times daily -  before meals and at bedtime. (Patient taking differently: Take 4 mg by mouth every 8 (eight) hours as needed (for nausea/vomiting). ) 120 tablet 3  . oxyCODONE-acetaminophen (PERCOCET) 10-325 MG tablet Take 1 tablet by mouth every 6 (six) hours as needed for pain.    . pantoprazole (PROTONIX) 40 MG  tablet Take 1 tablet (40 mg total) by mouth daily. Take 30 minutes before breakfast daily. 90 tablet 3  . potassium chloride (K-DUR) 10 MEQ tablet Take 10 mEq by mouth daily.    Marland Kitchen trolamine salicylate (SPORTSCREME) 10 % cream Apply 1 application topically 3 (three) times daily as needed (for back pain).    Marland Kitchen zolpidem (AMBIEN) 5 MG tablet Take 5 mg by mouth at bedtime as needed for sleep.     . peg 3350 powder (MOVIPREP) 100 g SOLR Take 1 kit (200 g total) by mouth as directed. (Patient not taking: Reported on 05/29/2017) 1 kit 0   No  current facility-administered medications for this visit.     Allergies as of 05/29/2017 - Review Complete 03/15/2017  Allergen Reaction Noted  . Tofranil [imipramine hcl] Shortness Of Breath 03/15/2017  . Aspirin  11/23/2016  . Tape Rash 11/23/2016    Family History  Problem Relation Age of Onset  . Colon cancer Neg Hx   . Colon polyps Neg Hx     Social History   Social History  . Marital status: Married    Spouse name: N/A  . Number of children: N/A  . Years of education: N/A   Occupational History  . disability    Social History Main Topics  . Smoking status: Current Every Day Smoker    Packs/day: 1.00    Years: 23.00  . Smokeless tobacco: Never Used  . Alcohol use No  . Drug use: No  . Sexual activity: Yes    Birth control/ protection: Surgical   Other Topics Concern  . Not on file   Social History Narrative  . No narrative on file    Review of Systems: As mentioned in HPI   Physical Exam: BP 125/88   Pulse (!) 102   Temp (!) 97.5 F (36.4 C) (Oral)   Ht '5\' 5"'  (1.651 m)   Wt 167 lb 9.6 oz (76 kg)   BMI 27.89 kg/m  General:   Alert and oriented. No distress noted. Pleasant and cooperative.  Head:  Normocephalic and atraumatic. Eyes:  Conjuctiva clear without scleral icterus. Mouth:  Oral mucosa pink and moist.  Heart:  S1, S2 present without murmurs Abdomen:  +BS, distended and full but soft, mild lower abdominal TTP. No rebound or guarding. No HSM or masses noted. Extremities:  With 1-2+ pitting edema extending to thigh  Neurologic:  Alert and  oriented x4;  grossly normal neurologically. Psych:  Alert and cooperative. Normal mood and affect.

## 2017-05-29 NOTE — Patient Instructions (Signed)
Please complete blood work today. I have also ordered a CT of your abdomen and pelvis.   We are then pursuing a colonoscopy with Dr. Darrick PennaFields in the near future.  For constipation: start taking Amitiza one gelcap WITH FOOD to avoid nausea (take with breakfast and dinner).  Call if no improvement with this.   It was good to see you!

## 2017-05-30 ENCOUNTER — Other Ambulatory Visit: Payer: Self-pay

## 2017-05-30 ENCOUNTER — Telehealth: Payer: Self-pay

## 2017-05-30 DIAGNOSIS — E875 Hyperkalemia: Secondary | ICD-10-CM

## 2017-05-30 DIAGNOSIS — K625 Hemorrhage of anus and rectum: Secondary | ICD-10-CM

## 2017-05-30 LAB — COMPLETE METABOLIC PANEL WITH GFR
ALT: 6 U/L (ref 6–29)
AST: 15 U/L (ref 10–30)
Albumin: 3.5 g/dL — ABNORMAL LOW (ref 3.6–5.1)
Alkaline Phosphatase: 70 U/L (ref 33–115)
BUN: 10 mg/dL (ref 7–25)
CHLORIDE: 105 mmol/L (ref 98–110)
CO2: 28 mmol/L (ref 20–32)
CREATININE: 0.81 mg/dL (ref 0.50–1.10)
Calcium: 9.3 mg/dL (ref 8.6–10.2)
GFR, Est Non African American: 89 mL/min (ref >=60)
GLUCOSE: 92 mg/dL (ref 65–99)
Potassium: 6 mmol/L — ABNORMAL HIGH (ref 3.5–5.3)
SODIUM: 141 mmol/L (ref 135–146)
Total Bilirubin: 0.2 mg/dL (ref 0.2–1.2)
Total Protein: 6.2 g/dL (ref 6.1–8.1)

## 2017-05-30 MED ORDER — PEG 3350-KCL-NA BICARB-NACL 420 G PO SOLR: 4000.0000 mL | ORAL | 0 refills | Status: DC

## 2017-05-30 NOTE — Progress Notes (Signed)
PT called and is aware. She will go if possible this afternoon, if not, she will go first thing tomorrow morning.

## 2017-05-30 NOTE — Telephone Encounter (Signed)
Called pt. TCS w/Propofol with [facility] scheduled for 07/02/17 at 11:00am. Rx for prep sent to pharmacy. Instructions mailed. Will notify pt later of pre-op appt. Orders entered.

## 2017-05-30 NOTE — Progress Notes (Signed)
Potassium elevated at 6. Unsure if this is true or not. It looks like there was slight hemolysis so this may be a lab issue. Please have her complete a BMP as soon as possible to recheck.

## 2017-05-30 NOTE — Progress Notes (Signed)
LMOM to call as soon as she gets the message, we need her to repeat some labs.

## 2017-05-30 NOTE — Patient Instructions (Signed)
PA info for CT abd/pelvis w/contrast submitted via [employer] website. Case approved. PA# [id number], 05/30/17-08/28/17.

## 2017-05-30 NOTE — Telephone Encounter (Signed)
Unable to schedule TCS w/Propofol with [facility] yesterday at [facility] d/t Epic was down. Tried to call pt's home number that she had requested to call first [phone]), no answer. Tried to call mobile number, no answer, LMOVM for her to call office.

## 2017-05-30 NOTE — Progress Notes (Signed)
Left Vm that it is very important for pt to go to lab today to repeat lab work due to elevated potassium.

## 2017-05-31 ENCOUNTER — Encounter: Payer: Self-pay | Admitting: Gastroenterology

## 2017-05-31 LAB — BASIC METABOLIC PANEL
BUN: 7 mg/dL (ref 7–25)
CALCIUM: 9.1 mg/dL (ref 8.6–10.2)
CO2: 25 mmol/L (ref 20–32)
Chloride: 104 mmol/L (ref 98–110)
Creat: 0.82 mg/dL (ref 0.50–1.10)
GLUCOSE: 116 mg/dL — AB (ref 65–99)
POTASSIUM: 5.6 mmol/L — AB (ref 3.5–5.3)
SODIUM: 139 mmol/L (ref 135–146)

## 2017-05-31 NOTE — Progress Notes (Signed)
Kim: your potassium is now just marginally elevated. However, it is still above normal. Make sure you stop taking potassium supplements as we discussed on the phone today.

## 2017-05-31 NOTE — Patient Instructions (Signed)
PA info for TCS submitted via [employer] website. Case Suspended. Outpatient authorization# [id number], 05/31/17-08/29/17.

## 2017-05-31 NOTE — Telephone Encounter (Signed)
Called and informed pt of pre-op appt 06/27/17 at 9:00am. Letter mailed with procedure instructions.

## 2017-06-01 NOTE — Assessment & Plan Note (Addendum)
Will continue to follow, continued involvement with PCP. Discussed significantly reducing intake of mountain dew, as she drinks a large amount daily.

## 2017-06-01 NOTE — Assessment & Plan Note (Signed)
Vague lower abdominal pain, bloating, likely secondary to constipation. However, her overall health decline, worsening lower extremity edema extending to thigh, are concerning. No known liver disease. CT abd/pelvis ordered prior to colonoscopy for further evaluation.

## 2017-06-01 NOTE — Assessment & Plan Note (Signed)
Continue macrocytosis despite folate supplementation. Referral to hematology already completed.

## 2017-06-01 NOTE — Assessment & Plan Note (Signed)
46 year old female previously with chronic diarrhea, now more constipated in setting of chronic narcotics. Rectal bleeding likely benign in setting of constipation but needs diagnostic colonoscopy.   Proceed with colonoscopy with Dr. Darrick PennaFields in the near future. The risks, benefits, and alternatives have been discussed in detail with the patient. They state understanding and desire to proceed.  PROPOFOL due to polypharmacy  Start Amitiza 24 mcg po BID, samples provided

## 2017-06-03 NOTE — Patient Instructions (Signed)
Received fax from [facility]. PA# [id number] for TCS, 05/31/17-08/29/17.

## 2017-06-03 NOTE — Progress Notes (Signed)
No pcp per patient 

## 2017-06-04 ENCOUNTER — Ambulatory Visit (HOSPITAL_COMMUNITY)
Admission: RE | Admit: 2017-06-04 | Discharge: 2017-06-04 | Disposition: A | Discharge status: Home / Self Care | Payer: PPO | Source: Ambulatory Visit | Attending: Gastroenterology | Admitting: Gastroenterology

## 2017-06-04 DIAGNOSIS — R1030 Lower abdominal pain, unspecified: Secondary | ICD-10-CM | POA: Insufficient documentation

## 2017-06-04 DIAGNOSIS — R109 Unspecified abdominal pain: Secondary | ICD-10-CM | POA: Diagnosis not present

## 2017-06-04 DIAGNOSIS — R1110 Vomiting, unspecified: Secondary | ICD-10-CM | POA: Diagnosis not present

## 2017-06-04 MED ORDER — IOPAMIDOL (ISOVUE-300) INJECTION 61%
100.0000 mL | Freq: Once | INTRAVENOUS | Status: AC | PRN
  Administered 2017-06-04: 100 mL via INTRAVENOUS

## 2017-06-06 ENCOUNTER — Encounter: Payer: Self-pay | Admitting: Gastroenterology

## 2017-06-06 DIAGNOSIS — Z79891 Long term (current) use of opiate analgesic: Secondary | ICD-10-CM | POA: Diagnosis not present

## 2017-06-06 DIAGNOSIS — M545 Low back pain: Secondary | ICD-10-CM | POA: Diagnosis not present

## 2017-06-06 DIAGNOSIS — G43719 Chronic migraine without aura, intractable, without status migrainosus: Secondary | ICD-10-CM | POA: Diagnosis not present

## 2017-06-06 DIAGNOSIS — K588 Other irritable bowel syndrome: Secondary | ICD-10-CM | POA: Diagnosis not present

## 2017-06-06 DIAGNOSIS — M5010 Cervical disc disorder with radiculopathy, unspecified cervical region: Secondary | ICD-10-CM | POA: Diagnosis not present

## 2017-06-06 DIAGNOSIS — F331 Major depressive disorder, recurrent, moderate: Secondary | ICD-10-CM | POA: Diagnosis not present

## 2017-06-06 DIAGNOSIS — I349 Nonrheumatic mitral valve disorder, unspecified: Secondary | ICD-10-CM | POA: Diagnosis not present

## 2017-06-06 DIAGNOSIS — F17210 Nicotine dependence, cigarettes, uncomplicated: Secondary | ICD-10-CM | POA: Diagnosis not present

## 2017-06-06 DIAGNOSIS — M797 Fibromyalgia: Secondary | ICD-10-CM | POA: Diagnosis not present

## 2017-06-06 DIAGNOSIS — M5116 Intervertebral disc disorders with radiculopathy, lumbar region: Secondary | ICD-10-CM | POA: Diagnosis not present

## 2017-06-06 NOTE — Progress Notes (Signed)
CT abdomen pelvis with normal liver. No acute abnormality.

## 2017-06-07 NOTE — Progress Notes (Signed)
REVIEWED. CANCELED TCS JUN 2018.

## 2017-06-14 ENCOUNTER — Other Ambulatory Visit: Payer: Self-pay | Admitting: Neurology

## 2017-06-14 DIAGNOSIS — M545 Low back pain: Principal | ICD-10-CM

## 2017-06-14 DIAGNOSIS — G8929 Other chronic pain: Secondary | ICD-10-CM

## 2017-06-21 DIAGNOSIS — M797 Fibromyalgia: Secondary | ICD-10-CM | POA: Diagnosis not present

## 2017-06-21 DIAGNOSIS — M5116 Intervertebral disc disorders with radiculopathy, lumbar region: Secondary | ICD-10-CM | POA: Diagnosis not present

## 2017-06-21 DIAGNOSIS — J209 Acute bronchitis, unspecified: Secondary | ICD-10-CM | POA: Diagnosis not present

## 2017-06-21 DIAGNOSIS — F331 Major depressive disorder, recurrent, moderate: Secondary | ICD-10-CM | POA: Diagnosis not present

## 2017-06-21 DIAGNOSIS — K588 Other irritable bowel syndrome: Secondary | ICD-10-CM | POA: Diagnosis not present

## 2017-06-21 DIAGNOSIS — M545 Low back pain: Secondary | ICD-10-CM | POA: Diagnosis not present

## 2017-06-21 DIAGNOSIS — I349 Nonrheumatic mitral valve disorder, unspecified: Secondary | ICD-10-CM | POA: Diagnosis not present

## 2017-06-21 DIAGNOSIS — Z79899 Other long term (current) drug therapy: Secondary | ICD-10-CM | POA: Diagnosis not present

## 2017-06-21 DIAGNOSIS — Z79891 Long term (current) use of opiate analgesic: Secondary | ICD-10-CM | POA: Diagnosis not present

## 2017-06-21 DIAGNOSIS — F17200 Nicotine dependence, unspecified, uncomplicated: Secondary | ICD-10-CM | POA: Diagnosis not present

## 2017-06-21 DIAGNOSIS — F17210 Nicotine dependence, cigarettes, uncomplicated: Secondary | ICD-10-CM | POA: Diagnosis not present

## 2017-06-21 DIAGNOSIS — Z720 Tobacco use: Secondary | ICD-10-CM | POA: Diagnosis not present

## 2017-06-21 DIAGNOSIS — M5010 Cervical disc disorder with radiculopathy, unspecified cervical region: Secondary | ICD-10-CM | POA: Diagnosis not present

## 2017-06-21 DIAGNOSIS — G8929 Other chronic pain: Secondary | ICD-10-CM | POA: Diagnosis not present

## 2017-06-21 DIAGNOSIS — G43719 Chronic migraine without aura, intractable, without status migrainosus: Secondary | ICD-10-CM | POA: Diagnosis not present

## 2017-06-25 ENCOUNTER — Encounter (HOSPITAL_COMMUNITY): Payer: PPO

## 2017-06-25 NOTE — Patient Instructions (Signed)
   Your procedure is scheduled on: 07/02/17  [facility] at  9:45   AM.  Call this number if you have problems the morning of surgery: [phone]   Remember:              Follow Directions on the letter you received from Your Physician's office regarding the Bowel Prep  :  Take these medicines the morning of surgery with A SIP OF WATER: Prozac, Singulair, Protonix, Gabapentin if needed, Oxycodone if needed   Do not wear jewelry, make-up or nail polish.    Do not bring valuables to the hospital.  Contacts, dentures or bridgework may not be worn into surgery.  .   Patients discharged the day of surgery will not be allowed to drive home.     Colonoscopy, Adult, Care After This sheet gives you information about how to care for yourself after your procedure. Your health care provider may also give you more specific instructions. If you have problems or questions, contact your health care provider. What can I expect after the procedure? After the procedure, it is common to have:  A small amount of blood in your stool for 24 hours after the procedure.  Some gas.  Mild abdominal cramping or bloating.  Follow these instructions at home: General instructions   For the first 24 hours after the procedure: ? Do not drive or use machinery. ? Do not sign important documents. ? Do not drink alcohol. ? Do your regular daily activities at a slower pace than normal. ? Eat soft, easy-to-digest foods. ? Rest often.  Take over-the-counter or prescription medicines only as told by your health care provider.  It is up to you to get the results of your procedure. Ask your health care provider, or the department performing the procedure, when your results will be ready. Relieving cramping and bloating  Try walking around when you have cramps or feel bloated.  Apply heat to your abdomen as told by your health care provider. Use a heat source that your health care provider recommends,  such as a moist heat pack or a heating pad. ? Place a towel between your skin and the heat source. ? Leave the heat on for 20-30 minutes. ? Remove the heat if your skin turns bright red. This is especially important if you are unable to feel pain, heat, or cold. You may have a greater risk of getting burned. Eating and drinking  Drink enough fluid to keep your urine clear or pale yellow.  Resume your normal diet as instructed by your health care provider. Avoid heavy or fried foods that are hard to digest.  Avoid drinking alcohol for as long as instructed by your health care provider. Contact a health care provider if:  You have blood in your stool 2-3 days after the procedure. Get help right away if:  You have more than a small spotting of blood in your stool.  You pass large blood clots in your stool.  Your abdomen is swollen.  You have nausea or vomiting.  You have a fever.  You have increasing abdominal pain that is not relieved with medicine. This information is not intended to replace advice given to you by your health care provider. Make sure you discuss any questions you have with your health care provider. Document Released: 05/22/2004 Document Revised: 07/02/2016 Document Reviewed: 12/20/2015 Elsevier Interactive Patient Education  [employer]2018 Elsevier Inc.

## 2017-06-27 ENCOUNTER — Encounter: Payer: Self-pay | Admitting: Gastroenterology

## 2017-06-27 ENCOUNTER — Encounter (HOSPITAL_COMMUNITY)
Admission: RE | Admit: 2017-06-27 | Discharge: 2017-06-27 | Disposition: A | Discharge status: Home / Self Care | Payer: PPO | Source: Ambulatory Visit | Attending: Gastroenterology | Admitting: Gastroenterology

## 2017-06-27 ENCOUNTER — Telehealth: Payer: Self-pay

## 2017-06-27 ENCOUNTER — Encounter (HOSPITAL_COMMUNITY): Payer: Self-pay

## 2017-06-27 ENCOUNTER — Telehealth: Payer: Self-pay | Admitting: Gastroenterology

## 2017-06-27 NOTE — Telephone Encounter (Signed)
CM cancelled procedure for 07/02/17. Routing to AB as FYI.

## 2017-06-27 NOTE — Telephone Encounter (Signed)
[facility] that pt was no show for her pre-op appt. Kim tried to call pt and LMOVM. I tried to call pt, no answer, LMOVM and gave her phone number to Endo Scheduler if she wants to reschedule pre-op. If no pre-op she will not be able to have procedure 07/02/17.

## 2017-06-27 NOTE — Telephone Encounter (Signed)
Please arrange appt in November for patient to see me. Thanks!

## 2017-06-28 ENCOUNTER — Other Ambulatory Visit (HOSPITAL_COMMUNITY): Payer: Self-pay | Admitting: Neurology

## 2017-06-28 ENCOUNTER — Encounter: Payer: Self-pay | Admitting: Gastroenterology

## 2017-06-28 DIAGNOSIS — R29898 Other symptoms and signs involving the musculoskeletal system: Secondary | ICD-10-CM

## 2017-06-28 NOTE — Telephone Encounter (Signed)
PATIENT SCHEDULED  °

## 2017-07-02 ENCOUNTER — Encounter (HOSPITAL_COMMUNITY): Admission: RE | Payer: Self-pay | Source: Ambulatory Visit

## 2017-07-02 ENCOUNTER — Ambulatory Visit (HOSPITAL_COMMUNITY): Admission: RE | Admit: 2017-07-02 | Payer: PPO | Source: Ambulatory Visit | Admitting: Gastroenterology

## 2017-07-02 SURGERY — COLONOSCOPY WITH PROPOFOL
Anesthesia: Monitor Anesthesia Care

## 2017-07-05 ENCOUNTER — Ambulatory Visit (HOSPITAL_COMMUNITY): Admission: RE | Admit: 2017-07-05 | Payer: PPO | Source: Ambulatory Visit

## 2017-07-17 ENCOUNTER — Ambulatory Visit
Admission: RE | Admit: 2017-07-17 | Discharge: 2017-07-17 | Disposition: A | Discharge status: Home / Self Care | Payer: PPO | Source: Ambulatory Visit | Attending: Neurology | Admitting: Neurology

## 2017-07-17 DIAGNOSIS — G8929 Other chronic pain: Secondary | ICD-10-CM

## 2017-07-17 DIAGNOSIS — M4326 Fusion of spine, lumbar region: Secondary | ICD-10-CM | POA: Diagnosis not present

## 2017-07-17 DIAGNOSIS — M545 Low back pain: Principal | ICD-10-CM

## 2017-07-17 MED ORDER — IOPAMIDOL (ISOVUE-M 200) INJECTION 41%
1.0000 mL | Freq: Once | INTRAMUSCULAR | Status: AC
  Administered 2017-07-17: 1 mL via EPIDURAL

## 2017-07-17 MED ORDER — METHYLPREDNISOLONE ACETATE 40 MG/ML INJ SUSP (RADIOLOG
120.0000 mg | Freq: Once | INTRAMUSCULAR | Status: AC
  Administered 2017-07-17: 120 mg via EPIDURAL

## 2017-07-17 NOTE — Discharge Instructions (Signed)

## 2017-07-23 ENCOUNTER — Encounter: Payer: Self-pay | Admitting: Family Medicine

## 2017-07-23 ENCOUNTER — Ambulatory Visit (INDEPENDENT_AMBULATORY_CARE_PROVIDER_SITE_OTHER): Payer: Medicare HMO | Admitting: Family Medicine

## 2017-07-23 VITALS — BP 142/92 | HR 72 | Temp 97.4°F | Resp 16 | Ht 65.0 in | Wt 162.0 lb

## 2017-07-23 DIAGNOSIS — F17210 Nicotine dependence, cigarettes, uncomplicated: Secondary | ICD-10-CM | POA: Diagnosis not present

## 2017-07-23 DIAGNOSIS — M5090 Cervical disc disorder, unspecified, unspecified cervical region: Secondary | ICD-10-CM | POA: Diagnosis not present

## 2017-07-23 DIAGNOSIS — Z8742 Personal history of other diseases of the female genital tract: Secondary | ICD-10-CM | POA: Insufficient documentation

## 2017-07-23 DIAGNOSIS — Z23 Encounter for immunization: Secondary | ICD-10-CM | POA: Diagnosis not present

## 2017-07-23 DIAGNOSIS — Z720 Tobacco use: Secondary | ICD-10-CM | POA: Insufficient documentation

## 2017-07-23 DIAGNOSIS — M961 Postlaminectomy syndrome, not elsewhere classified: Secondary | ICD-10-CM | POA: Diagnosis not present

## 2017-07-23 DIAGNOSIS — Z9109 Other allergy status, other than to drugs and biological substances: Secondary | ICD-10-CM | POA: Diagnosis not present

## 2017-07-23 DIAGNOSIS — Z87898 Personal history of other specified conditions: Secondary | ICD-10-CM

## 2017-07-23 DIAGNOSIS — F1190 Opioid use, unspecified, uncomplicated: Secondary | ICD-10-CM | POA: Diagnosis not present

## 2017-07-23 MED ORDER — FLUOXETINE HCL 40 MG PO CAPS: 40.0000 mg | ORAL_CAPSULE | Freq: Every day | ORAL | 3 refills | Status: AC

## 2017-07-23 NOTE — Patient Instructions (Signed)
Need records Dr Loney Hering Come back for visit in a month Walk every day that you are able Due for a PAP and PE

## 2017-07-23 NOTE — Progress Notes (Signed)
Chief Complaint  Patient presents with  . Follow-up    est.   New patient Failed back syndrome after 6 back surgeries for disc disease on chronic QID Oxycodone from pain provider.  Takes 20 mg of flexeril at night.  Requests ambien in addition and I explain that this is not medically recommended because of risk of sedation. Goes to hematology for treatment of chronic macrocytosis.  No alcohol.  No vitamin deficiency noted.  No anemia I have discussed the multiple health risks associated with cigarette smoking including, but not limited to, cardiovascular disease, lung disease and cancer.  I have strongly recommended that smoking be stopped.  I have reviewed the various methods of quitting including cold [place], classes, nicotine replacements and prescription medications.  I have offered assistance in this difficult process.  The patient is not interested in assistance at this time. Terrible dentition with no dental care History of "all" of her pap smears being abnormal, and no pap in years.  Agrees to come for a PAP and PE next visit. Denies abnormal bleeding or discharge.  Chronic constipation from narcotic use.  Under care GI Agrees to flu shot and prevnar today  Patient Active Problem List   Diagnosis Date Noted  . Failed back surgical syndrome 07/23/2017  . Cervical disc disease 07/23/2017  . Tobacco abuse disorder 07/23/2017  . History of abnormal cervical Pap smear 07/23/2017  . Chronic narcotic use 07/23/2017  . Environmental allergies 07/23/2017  . Constipation 05/29/2017  . Macrocytosis without anemia 02/26/2017  . Chronic back pain 08/15/2009    Outpatient Encounter Prescriptions as of 07/23/2017  Medication Sig  . Albuterol Sulfate 108 (90 Base) MCG/ACT AEPB Inhale 2 puffs into the lungs every 6 (six) hours as needed (wheezing /shortness of breath).  . calcium carbonate (OSCAL) 1500 (600 Ca) MG TABS tablet Take 600 mg by mouth daily.  . cyclobenzaprine (FLEXERIL) 10 MG  tablet Take 20 mg by mouth at bedtime.   Marland Kitchen FLUoxetine (PROZAC) 40 MG capsule Take 1 capsule (40 mg total) by mouth daily.  . folic acid (FOLVITE) 1 MG tablet Take 1 mg by mouth daily.  Marland Kitchen gabapentin (NEURONTIN) 400 MG capsule Take 400 mg by mouth 3 (three) times daily. Take 1 capsules 400 mg in the morning & 2 capsules (800 mg) at night   . ibuprofen (ADVIL,MOTRIN) 600 MG tablet Take 600 mg by mouth 2 (two) times daily.   Marland Kitchen lubiprostone (AMITIZA) 24 MCG capsule Take 48 mcg by mouth daily with breakfast.  . montelukast (SINGULAIR) 10 MG tablet Take 10 mg by mouth daily.   Marland Kitchen oxyCODONE-acetaminophen (PERCOCET) 10-325 MG tablet Take 1 tablet by mouth every 6 (six) hours as needed for pain.  . pantoprazole (PROTONIX) 40 MG tablet Take 1 tablet (40 mg total) by mouth daily. Take 30 minutes before breakfast daily.  . promethazine (PHENERGAN) 25 MG tablet Take 25 mg by mouth every 8 (eight) hours as needed for nausea or vomiting.   . trolamine salicylate (SPORTSCREME) 10 % cream Apply 1 application topically 3 (three) times daily as needed (for back pain).   No facility-administered encounter medications on file as of 07/23/2017.     Past Medical History:  Diagnosis Date  . Allergy   . Anemia   . Anxiety   . Chronic back pain   . Depression   . GERD (gastroesophageal reflux disease)   . Heart murmur   . Hypertension   . IBS (irritable bowel syndrome)   .  Insomnia   . Migraine   . Mitral valve prolapse   . Neuromuscular disorder [facility])     Past Surgical History:  Procedure Laterality Date  . BACK SURGERY     X 6  . CHOLECYSTECTOMY    . DILATION AND CURETTAGE OF UTERUS    . SPINE SURGERY     six back surgeries  . TONSILLECTOMY      Social History   Social History  . Marital status: Married    Spouse name: Dorinda Hill  . Number of children: 2  . Years of education: 12   Occupational History  . disability     back   Social History Main Topics  . Smoking status: Current Every Day  Smoker    Packs/day: 1.00    Years: 23.00    Types: Cigarettes    Start date: 10/23/1991  . Smokeless tobacco: Never Used  . Alcohol use Yes     Comment: rare  . Drug use: No  . Sexual activity: Yes    Birth control/ protection: Surgical   Other Topics Concern  . Not on file   Social History Narrative   Lives at home with Dorinda Hill   Married 3 years   Likes to ride Astronomer   Has a puppy    Family History  Problem Relation Age of Onset  . Adopted: Yes  . Cancer Mother        lung  . Hypertension Mother   . Parkinson's disease Maternal Grandmother   . Kidney disease Maternal Grandmother        dialysis  . Hypertension Maternal Grandmother   . Early death Maternal Grandfather        MVA  . Colon cancer Neg Hx   . Colon polyps Neg Hx     Review of Systems  Constitutional: Positive for malaise/fatigue. Negative for chills, fever and weight loss.       Weight gain  HENT: Negative for congestion and hearing loss.   Eyes: Negative for blurred vision and pain.  Respiratory: Negative for cough and shortness of breath.   Cardiovascular: Negative for chest pain and leg swelling.  Gastrointestinal: Positive for constipation. Negative for abdominal pain, diarrhea and heartburn.  Genitourinary: Negative for dysuria and frequency.  Musculoskeletal: Positive for back pain, myalgias and neck pain. Negative for falls and joint pain.  Neurological: Negative for dizziness, seizures and headaches.  Psychiatric/Behavioral: Negative for depression. The patient is nervous/anxious. The patient does not have insomnia.     BP (!) 142/92 (BP Location: Right Arm, Patient Position: Sitting, Cuff Size: Normal)   Pulse 72   Temp (!) 97.4 F (36.3 C) (Temporal)   Resp 16   Ht  (1.651 m)   Wt 162 lb 0.6 oz (73.5 kg)   BMI 26.96 kg/m   Physical Exam  Constitutional: She is oriented to person, place, and time. She appears well-developed and well-nourished.  Mild sedation/slow  responses  HENT:  Head: Normocephalic and atraumatic.  Mouth/Throat: Oropharynx is clear and moist.  Teeth broken and carious  Eyes: Pupils are equal, round, and reactive to light. Conjunctivae are normal.  Neck: Normal range of motion. Neck supple. No thyromegaly present.  Cardiovascular: Normal rate, regular rhythm and normal heart sounds.   Pulmonary/Chest: Effort normal and breath sounds normal. No respiratory distress. She has no wheezes.  Musculoskeletal: Normal range of motion. She exhibits no edema.  Lymphadenopathy:    She has no cervical adenopathy.  Neurological:  She is alert and oriented to person, place, and time.  Gait normal  Skin: Skin is warm and dry.  Psychiatric: She has a normal mood and affect. Her behavior is normal. Thought content normal.  Nursing note and vitals reviewed.  ASSESSMENT/PLAN:  1. Failed back surgical syndrome   2. Cervical disc disease   3. Tobacco abuse disorder   4. Needs flu shot  - Flu Vaccine QUAD 36+ mos IM  5. Need for pneumococcal vaccination  - Pneumococcal conjugate vaccine 13-valent IM  6. History of abnormal cervical Pap smear   7. Chronic narcotic use   8. Environmental allergies    Patient Instructions  Need records Dr Loney Hering Come back for visit in a month Walk every day that you are able Due for a PAP and PE   Eustace Moore, MD

## 2017-07-24 ENCOUNTER — Encounter (HOSPITAL_COMMUNITY): Payer: Self-pay

## 2017-07-24 ENCOUNTER — Ambulatory Visit (HOSPITAL_COMMUNITY)
Admission: RE | Admit: 2017-07-24 | Discharge: 2017-07-24 | Disposition: A | Discharge status: Home / Self Care | Payer: Medicare HMO | Source: Ambulatory Visit | Attending: Neurology | Admitting: Neurology

## 2017-07-24 ENCOUNTER — Encounter (HOSPITAL_COMMUNITY): Payer: Medicare HMO

## 2017-07-24 ENCOUNTER — Encounter (HOSPITAL_COMMUNITY): Payer: Medicare HMO | Attending: Oncology | Admitting: Oncology

## 2017-07-24 VITALS — BP 152/95 | HR 87 | Resp 16 | Ht 65.0 in | Wt 161.0 lb

## 2017-07-24 DIAGNOSIS — D7589 Other specified diseases of blood and blood-forming organs: Secondary | ICD-10-CM | POA: Diagnosis not present

## 2017-07-24 DIAGNOSIS — K580 Irritable bowel syndrome with diarrhea: Secondary | ICD-10-CM | POA: Diagnosis not present

## 2017-07-24 DIAGNOSIS — F419 Anxiety disorder, unspecified: Secondary | ICD-10-CM | POA: Insufficient documentation

## 2017-07-24 DIAGNOSIS — Z801 Family history of malignant neoplasm of trachea, bronchus and lung: Secondary | ICD-10-CM | POA: Diagnosis not present

## 2017-07-24 DIAGNOSIS — M545 Low back pain: Secondary | ICD-10-CM | POA: Diagnosis not present

## 2017-07-24 DIAGNOSIS — R110 Nausea: Secondary | ICD-10-CM | POA: Diagnosis not present

## 2017-07-24 DIAGNOSIS — Z888 Allergy status to other drugs, medicaments and biological substances status: Secondary | ICD-10-CM | POA: Diagnosis not present

## 2017-07-24 DIAGNOSIS — M549 Dorsalgia, unspecified: Secondary | ICD-10-CM | POA: Insufficient documentation

## 2017-07-24 DIAGNOSIS — Z91048 Other nonmedicinal substance allergy status: Secondary | ICD-10-CM | POA: Diagnosis not present

## 2017-07-24 DIAGNOSIS — G8929 Other chronic pain: Secondary | ICD-10-CM | POA: Diagnosis not present

## 2017-07-24 DIAGNOSIS — K219 Gastro-esophageal reflux disease without esophagitis: Secondary | ICD-10-CM | POA: Insufficient documentation

## 2017-07-24 DIAGNOSIS — R5382 Chronic fatigue, unspecified: Secondary | ICD-10-CM

## 2017-07-24 DIAGNOSIS — Z79899 Other long term (current) drug therapy: Secondary | ICD-10-CM | POA: Diagnosis not present

## 2017-07-24 DIAGNOSIS — Z791 Long term (current) use of non-steroidal anti-inflammatories (NSAID): Secondary | ICD-10-CM | POA: Insufficient documentation

## 2017-07-24 DIAGNOSIS — F17210 Nicotine dependence, cigarettes, uncomplicated: Secondary | ICD-10-CM | POA: Diagnosis not present

## 2017-07-24 DIAGNOSIS — Z720 Tobacco use: Secondary | ICD-10-CM

## 2017-07-24 DIAGNOSIS — I341 Nonrheumatic mitral (valve) prolapse: Secondary | ICD-10-CM | POA: Diagnosis not present

## 2017-07-24 DIAGNOSIS — Z8249 Family history of ischemic heart disease and other diseases of the circulatory system: Secondary | ICD-10-CM | POA: Diagnosis not present

## 2017-07-24 DIAGNOSIS — I10 Essential (primary) hypertension: Secondary | ICD-10-CM | POA: Insufficient documentation

## 2017-07-24 DIAGNOSIS — Z820 Family history of epilepsy and other diseases of the nervous system: Secondary | ICD-10-CM | POA: Diagnosis not present

## 2017-07-24 DIAGNOSIS — M7989 Other specified soft tissue disorders: Secondary | ICD-10-CM | POA: Diagnosis not present

## 2017-07-24 DIAGNOSIS — F5104 Psychophysiologic insomnia: Secondary | ICD-10-CM | POA: Diagnosis not present

## 2017-07-24 DIAGNOSIS — M4807 Spinal stenosis, lumbosacral region: Secondary | ICD-10-CM | POA: Insufficient documentation

## 2017-07-24 DIAGNOSIS — Z9889 Other specified postprocedural states: Secondary | ICD-10-CM | POA: Insufficient documentation

## 2017-07-24 DIAGNOSIS — G4700 Insomnia, unspecified: Secondary | ICD-10-CM | POA: Insufficient documentation

## 2017-07-24 DIAGNOSIS — R29898 Other symptoms and signs involving the musculoskeletal system: Secondary | ICD-10-CM | POA: Insufficient documentation

## 2017-07-24 DIAGNOSIS — Z841 Family history of disorders of kidney and ureter: Secondary | ICD-10-CM | POA: Diagnosis not present

## 2017-07-24 DIAGNOSIS — Z9049 Acquired absence of other specified parts of digestive tract: Secondary | ICD-10-CM | POA: Insufficient documentation

## 2017-07-24 DIAGNOSIS — F329 Major depressive disorder, single episode, unspecified: Secondary | ICD-10-CM | POA: Diagnosis not present

## 2017-07-24 DIAGNOSIS — Z79891 Long term (current) use of opiate analgesic: Secondary | ICD-10-CM | POA: Insufficient documentation

## 2017-07-24 LAB — COMPREHENSIVE METABOLIC PANEL
ALK PHOS: 76 U/L (ref 38–126)
ALT: 7 U/L — AB (ref 14–54)
AST: 13 U/L — AB (ref 15–41)
Albumin: 3.8 g/dL (ref 3.5–5.0)
Anion gap: 7 (ref 5–15)
BILIRUBIN TOTAL: 0.2 mg/dL — AB (ref 0.3–1.2)
BUN: 8 mg/dL (ref 6–20)
CALCIUM: 8.9 mg/dL (ref 8.9–10.3)
CO2: 30 mmol/L (ref 22–32)
CREATININE: 0.8 mg/dL (ref 0.44–1.00)
Chloride: 99 mmol/L — ABNORMAL LOW (ref 101–111)
GFR calc Af Amer: >60 mL/min (ref >60)
Glucose, Bld: 87 mg/dL (ref 65–99)
POTASSIUM: 3.4 mmol/L — AB (ref 3.5–5.1)
Sodium: 136 mmol/L (ref 135–145)
TOTAL PROTEIN: 7 g/dL (ref 6.5–8.1)

## 2017-07-24 LAB — CBC WITH DIFFERENTIAL/PLATELET
BASOS ABS: 0 10*3/uL (ref 0.0–0.1)
Basophils Relative: 0 %
Eosinophils Absolute: 0 10*3/uL (ref 0.0–0.7)
Eosinophils Relative: 1 %
HEMATOCRIT: 39.2 % (ref 36.0–46.0)
HEMOGLOBIN: 13.1 g/dL (ref 12.0–15.0)
LYMPHS PCT: 16 %
Lymphs Abs: 1.1 10*3/uL (ref 0.7–4.0)
MCH: 33.2 pg (ref 26.0–34.0)
MCHC: 33.4 g/dL (ref 30.0–36.0)
MCV: 99.2 fL (ref 78.0–100.0)
MONO ABS: 0.5 10*3/uL (ref 0.1–1.0)
Monocytes Relative: 8 %
NEUTROS ABS: 5.1 10*3/uL (ref 1.7–7.7)
Neutrophils Relative %: 75 %
Platelets: 215 10*3/uL (ref 150–400)
RBC: 3.95 MIL/uL (ref 3.87–5.11)
RDW: 13 % (ref 11.5–15.5)
WBC: 6.7 10*3/uL (ref 4.0–10.5)

## 2017-07-24 LAB — VITAMIN B12: VITAMIN B 12: 539 pg/mL (ref 180–914)

## 2017-07-24 LAB — RETICULOCYTES
RBC.: 3.95 MIL/uL (ref 3.87–5.11)
RETIC CT PCT: 2.7 % (ref 0.4–3.1)
Retic Count, Absolute: 106.7 10*3/uL (ref 19.0–186.0)

## 2017-07-24 LAB — TSH: TSH: 1.766 u[IU]/mL (ref 0.350–4.500)

## 2017-07-24 LAB — FERRITIN: FERRITIN: 23 ng/mL (ref 11–307)

## 2017-07-24 LAB — IRON AND TIBC
Iron: 48 ug/dL (ref 28–170)
Saturation Ratios: 15 % (ref 10.4–31.8)
TIBC: 314 ug/dL (ref 250–450)
UIBC: 266 ug/dL

## 2017-07-24 LAB — FOLATE: FOLATE: 28 ng/mL (ref >5.9)

## 2017-07-24 NOTE — Progress Notes (Signed)
[facility] Initial Visit:  Patient Care Team: Patient, No Pcp Per as PCP - General ([place]) Fields, Marga Melnick, MD as Consulting Physician (Gastroenterology)  CHIEF COMPLAINTS/PURPOSE OF CONSULTATION:  Macrocytosis  HISTORY OF PRESENTING ILLNESS: Cynthia Finley 46 y.o. female presents today for evaluation of chronic macrocytosis. Her most recent CBC from 05/13/2017 demonstrated WBC 7.3K, hemoglobin 11.7 g/dL, hematocrit 36.2%, MCV 103.1, platelet count 254K, differential was normal. Previous CBCs from February through April 2018 demonstrated that she was not anemic but she still had a macrocytosis ranging between 101.1-104.1. Folate level and B12 level were both normal in July 2018. Patient states she was previously on vitamin B12 supplementation but stopped after her levels were very high. She is currently on Foley catheter daily. She denies any history of thyroid disease, liver disease, chronic alcohol use. She has chronic fatigue. She also has chronic back pain for the past 20+ years for which she is on chronic narcotics. She also has chronic insomnia for which she seen her PCP Dr. Meda Coffee. Patient also has nausea and diarrhea almost every time after she eats, but other times she would have constipation as well. She would have occasional leg swelling. She denies any chest pain, shortness of breath, abdominal pain. No recent infections or fevers or chills.  Review of Systems - Oncology ROS as per HPI otherwise 12 point ROS is negative.  MEDICAL HISTORY: Past Medical History:  Diagnosis Date  . Allergy   . Anemia   . Anxiety   . Chronic back pain   . Depression   . GERD (gastroesophageal reflux disease)   . Heart murmur   . Hypertension   . IBS (irritable bowel syndrome)   . Insomnia   . Migraine   . Mitral valve prolapse   . Neuromuscular disorder [facility])     SURGICAL HISTORY: Past Surgical History:  Procedure Laterality Date  . BACK SURGERY     X 6   . CHOLECYSTECTOMY    . DILATION AND CURETTAGE OF UTERUS    . SPINE SURGERY     six back surgeries  . TONSILLECTOMY      SOCIAL HISTORY: Social History   Social History  . Marital status: Married    Spouse name: Elenore Rota  . Number of children: 2  . Years of education: 12   Occupational History  . disability     back   Social History Main Topics  . Smoking status: Current Every Day Smoker    Packs/day: 1.00    Years: 23.00    Types: Cigarettes    Start date: 10/23/1991  . Smokeless tobacco: Never Used  . Alcohol use Yes     Comment: rare  . Drug use: No  . Sexual activity: Yes    Birth control/ protection: Surgical   Other Topics Concern  . 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 Adopted: Yes  . Cancer Mother        lung  . Hypertension Mother   . Parkinson's disease Maternal Grandmother   . Kidney disease Maternal Grandmother        dialysis  . Hypertension Maternal Grandmother   . Early death Maternal Grandfather        MVA  . Colon  cancer Neg Hx   . Colon polyps Neg Hx     ALLERGIES:  is allergic to tofranil [imipramine hcl] and tape.  MEDICATIONS:  Current Outpatient Prescriptions  Medication Sig Dispense Refill  . Albuterol Sulfate 108 (90 Base) MCG/ACT AEPB Inhale 2 puffs into the lungs every 6 (six) hours as needed (wheezing /shortness of breath).    . calcium carbonate (OSCAL) 1500 (600 Ca) MG TABS tablet Take 600 mg by mouth daily.    . cyclobenzaprine (FLEXERIL) 10 MG tablet Take 20 mg by mouth at bedtime.     Marland Kitchen FLUoxetine (PROZAC) 40 MG capsule Take 1 capsule (40 mg total) by mouth daily. 90 capsule 3  . folic acid (FOLVITE) 1 MG tablet Take 1 mg by mouth daily.    Marland Kitchen gabapentin (NEURONTIN) 400 MG capsule Take 400 mg by mouth 3 (three) times daily. Take 1 capsules 400 mg in the  morning & 2 capsules (800 mg) at night     . ibuprofen (ADVIL,MOTRIN) 600 MG tablet Take 600 mg by mouth 2 (two) times daily.     Marland Kitchen lubiprostone (AMITIZA) 24 MCG capsule Take 48 mcg by mouth daily with breakfast.    . montelukast (SINGULAIR) 10 MG tablet Take 10 mg by mouth daily.     Marland Kitchen oxyCODONE-acetaminophen (PERCOCET) 10-325 MG tablet Take 1 tablet by mouth every 6 (six) hours as needed for pain.    . pantoprazole (PROTONIX) 40 MG tablet Take 1 tablet (40 mg total) by mouth daily. Take 30 minutes before breakfast daily. 90 tablet 3  . promethazine (PHENERGAN) 25 MG tablet Take 25 mg by mouth every 8 (eight) hours as needed for nausea or vomiting.     . trolamine salicylate (SPORTSCREME) 10 % cream Apply 1 application topically 3 (three) times daily as needed (for back pain).     No current facility-administered medications for this visit.     PHYSICAL EXAMINATION:  Vitals:   07/24/17 1337  BP: (!) 152/95  Pulse: 87  Resp: 16  SpO2: 99%      Physical Exam  Constitutional: Well-developed, well-nourished, and in no distress.   HENT:  Head: Normocephalic and atraumatic. Poor dentition. Mouth/Throat: No oropharyngeal exudate. Mucosa moist. Eyes: Pupils are equal, round, and reactive to light. Conjunctivae are normal. No scleral icterus.  Neck: Normal range of motion. Neck supple. No JVD present.  Cardiovascular: Normal rate, regular rhythm and normal heart sounds.  Exam reveals no gallop and no friction rub.   No murmur heard. Pulmonary/Chest: Effort normal and breath sounds normal. No respiratory distress. No wheezes.No rales.  Abdominal: Soft. Bowel sounds are normal. No distension. There is no tenderness. There is no guarding.  Musculoskeletal: No edema or tenderness.  Lymphadenopathy:    No cervical or supraclavicular adenopathy.  Neurological: Alert and oriented to person, place, and time. No cranial nerve deficit.  Skin: Skin is warm and dry. No rash noted. No erythema. No  pallor. Multiple tattoos. Psychiatric: Affect and judgment normal.  LABORATORY DATA: I have personally reviewed the data as listed:  No visits with results within 1 Month(s) from this visit.  Latest known visit with results is:  Orders Only on 05/30/2017  Component Date Value Ref Range Status  . Sodium 05/30/2017 139  135 - 146 mmol/L Final  . Potassium 05/30/2017 5.6* 3.5 - 5.3 mmol/L Final   Slight Hemolysis  . Chloride 05/30/2017 104  98 - 110 mmol/L Final  . CO2 05/30/2017 25  20 - 32 mmol/L Final  Comment: ** Please note change in reference range(s). **     . Glucose, Bld 05/30/2017 116* 65 - 99 mg/dL Final  . BUN 05/30/2017 7  7 - 25 mg/dL Final  . Creat 05/30/2017 0.82  0.50 - 1.10 mg/dL Final  . Calcium 05/30/2017 9.1  8.6 - 10.2 mg/dL Final    RADIOGRAPHIC STUDIES: I have personally reviewed the radiological images as listed and agree with the findings in the report  No results found.  ASSESSMENT/PLAN Chronic macrocytosis  PLAN: -Review of her medication list does not show that any of her drugs would be the cause of her macrocytosis. -Differential causes for macrocytosis include vitamin B12 deficiency, folate deficiency, myelodysplastic syndrome, reticulocytosis, liver disease, hypothyroidism, chronic alcohol abuse, multiple myeloma and other plasma cell disorders.I will plan to rule these out by doing lab work as stated below. -Return to clinic in 2 weeks for follow-up with review of lab work next plan of care.  Orders Placed This Encounter  Procedures  . CBC with Differential    Standing Status:   Future    Standing Expiration Date:   07/24/2018  . Comprehensive metabolic panel    Standing Status:   Future    Standing Expiration Date:   07/24/2018  . Erythropoietin    Standing Status:   Future    Standing Expiration Date:   07/24/2018  . Iron and TIBC    Standing Status:   Future    Standing Expiration Date:   07/24/2018  . Ferritin    Standing Status:    Future    Standing Expiration Date:   07/24/2018  . Vitamin B12    Standing Status:   Future    Standing Expiration Date:   07/24/2018  . Folate    Standing Status:   Future    Standing Expiration Date:   07/24/2018  . TSH    Standing Status:   Future    Standing Expiration Date:   07/24/2018  . Protein electrophoresis, serum    Standing Status:   Future    Standing Expiration Date:   07/24/2018  . Immunofixation electrophoresis    Standing Status:   Future    Standing Expiration Date:   07/24/2018  . Reticulocytes    Standing Status:   Future    Standing Expiration Date:   07/24/2018    All questions were answered. The patient knows to call the clinic with any problems, questions or concerns.  This note was electronically signed.    Twana First, MD  07/24/2017 1:12 PM

## 2017-07-25 LAB — PROTEIN ELECTROPHORESIS, SERUM
A/G Ratio: 1 (ref 0.7–1.7)
ALPHA-2-GLOBULIN: 0.8 g/dL (ref 0.4–1.0)
Albumin ELP: 3.3 g/dL (ref 2.9–4.4)
Alpha-1-Globulin: 0.3 g/dL (ref 0.0–0.4)
BETA GLOBULIN: 1.1 g/dL (ref 0.7–1.3)
GLOBULIN, TOTAL: 3.2 g/dL (ref 2.2–3.9)
Gamma Globulin: 1.1 g/dL (ref 0.4–1.8)
Total Protein ELP: 6.5 g/dL (ref 6.0–8.5)

## 2017-07-25 LAB — IMMUNOFIXATION ELECTROPHORESIS
IGG (IMMUNOGLOBIN G), SERUM: 974 mg/dL (ref 700–1600)
IgA: 307 mg/dL (ref 87–352)
IgM (Immunoglobulin M), Srm: 105 mg/dL (ref 26–217)
Total Protein ELP: 6.5 g/dL (ref 6.0–8.5)

## 2017-07-25 LAB — ERYTHROPOIETIN: ERYTHROPOIETIN: 16.3 m[IU]/mL (ref 2.6–18.5)

## 2017-08-05 ENCOUNTER — Encounter (HOSPITAL_COMMUNITY): Payer: Self-pay | Admitting: Oncology

## 2017-08-05 ENCOUNTER — Encounter (HOSPITAL_BASED_OUTPATIENT_CLINIC_OR_DEPARTMENT_OTHER): Payer: Medicare HMO | Admitting: Oncology

## 2017-08-05 VITALS — BP 131/94 | HR 106 | Temp 97.8°F | Wt 160.9 lb

## 2017-08-05 DIAGNOSIS — Z720 Tobacco use: Secondary | ICD-10-CM | POA: Diagnosis not present

## 2017-08-05 DIAGNOSIS — R110 Nausea: Secondary | ICD-10-CM

## 2017-08-05 DIAGNOSIS — G8929 Other chronic pain: Secondary | ICD-10-CM | POA: Diagnosis not present

## 2017-08-05 DIAGNOSIS — D7589 Other specified diseases of blood and blood-forming organs: Secondary | ICD-10-CM

## 2017-08-05 DIAGNOSIS — R5382 Chronic fatigue, unspecified: Secondary | ICD-10-CM

## 2017-08-05 DIAGNOSIS — M545 Low back pain: Secondary | ICD-10-CM

## 2017-08-05 DIAGNOSIS — G4700 Insomnia, unspecified: Secondary | ICD-10-CM | POA: Diagnosis not present

## 2017-08-05 DIAGNOSIS — R197 Diarrhea, unspecified: Secondary | ICD-10-CM

## 2017-08-05 NOTE — Progress Notes (Signed)
[facility] Initial Visit:  Patient Care Team: Raylene Everts, MD as PCP - General (Family Medicine) Danie Binder, MD as Consulting Physician (Gastroenterology)  CHIEF COMPLAINTS/PURPOSE OF CONSULTATION:  Macrocytosis  HISTORY OF PRESENTING ILLNESS: Cynthia Finley 46 y.o. female presents today for evaluation of chronic macrocytosis. Her most recent CBC from 05/13/2017 demonstrated WBC 7.3K, hemoglobin 11.7 g/dL, hematocrit 36.2%, MCV 103.1, platelet count 254K, differential was normal. Previous CBCs from February through April 2018 demonstrated that she was not anemic but she still had a macrocytosis ranging between 101.1-104.1. Folate level and B12 level were both normal in July 2018. Patient states she was previously on vitamin B12 supplementation but stopped after her levels were very high. She is currently on Foley catheter daily. She denies any history of thyroid disease, liver disease, chronic alcohol use. She has chronic fatigue. She also has chronic back pain for the past 20+ years for which she is on chronic narcotics. She also has chronic insomnia for which she seen her PCP Dr. Meda Coffee. Patient also has nausea and diarrhea almost every time after she eats, but other times she would have constipation as well. She would have occasional leg swelling. She denies any chest pain, shortness of breath, abdominal pain. No recent infections or fevers or chills.  Review of Systems - Oncology ROS as per HPI otherwise 12 point ROS is negative.  MEDICAL HISTORY: Past Medical History:  Diagnosis Date  . Allergy   . Anemia   . Anxiety   . Chronic back pain   . Depression   . Fibromyalgia   . GERD (gastroesophageal reflux disease)   . Heart murmur   . Hypertension   . IBS (irritable bowel syndrome)   . Insomnia   . Migraine   . Mitral valve prolapse   . Neuromuscular disorder [facility])     SURGICAL HISTORY: Past Surgical History:  Procedure Laterality Date  .  BACK SURGERY     X 6  . CHOLECYSTECTOMY    . DILATION AND CURETTAGE OF UTERUS    . SPINE SURGERY     six back surgeries  . TONSILLECTOMY      SOCIAL HISTORY: Social History   Social History  . Marital status: Married    Spouse name: Elenore Rota  . Number of children: 2  . Years of education: 12   Occupational History  . disability     back   Social History Main Topics  . Smoking status: Current Every Day Smoker    Packs/day: 1.00    Years: 25.00    Types: Cigarettes    Start date: 10/23/1991  . Smokeless tobacco: Never Used  . Alcohol use No     Comment: rare  . Drug use: No  . Sexual activity: Yes    Birth control/ protection: Surgical   Other Topics Concern  . 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 Adopted: Yes  . Cancer Mother        lung  . Hypertension Mother   . Parkinson's disease Maternal Grandmother   . Kidney disease Maternal Grandmother        dialysis  . Hypertension Maternal Grandmother   . Early death Maternal Grandfather  MVA  . Colon cancer Neg Hx   . Colon polyps Neg Hx     ALLERGIES:  is allergic to tofranil [imipramine hcl] and tape.  MEDICATIONS:  Current Outpatient Prescriptions  Medication Sig Dispense Refill  . Albuterol Sulfate 108 (90 Base) MCG/ACT AEPB Inhale 2 puffs into the lungs every 6 (six) hours as needed (wheezing /shortness of breath).    . calcium carbonate (OSCAL) 1500 (600 Ca) MG TABS tablet Take 600 mg by mouth daily.    . cyclobenzaprine (FLEXERIL) 10 MG tablet Take 20 mg by mouth at bedtime.     Marland Kitchen FLUoxetine (PROZAC) 40 MG capsule Take 1 capsule (40 mg total) by mouth daily. 90 capsule 3  . folic acid (FOLVITE) 1 MG tablet Take 1 mg by mouth daily.    Marland Kitchen gabapentin (NEURONTIN) 400 MG capsule Take 400 mg by mouth 3 (three) times daily. Take 1  capsules 400 mg in the morning & 2 capsules (800 mg) at night     . ibuprofen (ADVIL,MOTRIN) 600 MG tablet Take 600 mg by mouth 2 (two) times daily.     Marland Kitchen lubiprostone (AMITIZA) 24 MCG capsule Take 48 mcg by mouth daily with breakfast.    . montelukast (SINGULAIR) 10 MG tablet Take 10 mg by mouth daily.     Marland Kitchen oxyCODONE-acetaminophen (PERCOCET) 10-325 MG tablet Take 1 tablet by mouth every 6 (six) hours as needed for pain.    . pantoprazole (PROTONIX) 40 MG tablet Take 1 tablet (40 mg total) by mouth daily. Take 30 minutes before breakfast daily. 90 tablet 3  . promethazine (PHENERGAN) 25 MG tablet Take 25 mg by mouth every 8 (eight) hours as needed for nausea or vomiting.     . trolamine salicylate (SPORTSCREME) 10 % cream Apply 1 application topically 3 (three) times daily as needed (for back pain).     No current facility-administered medications for this visit.     PHYSICAL EXAMINATION:  There were no vitals filed for this visit.    Physical Exam  Constitutional: Well-developed, well-nourished, and in no distress.   HENT:  Head: Normocephalic and atraumatic. Poor dentition. Mouth/Throat: No oropharyngeal exudate. Mucosa moist. Eyes: Pupils are equal, round, and reactive to light. Conjunctivae are normal. No scleral icterus.  Neck: Normal range of motion. Neck supple. No JVD present.  Cardiovascular: Normal rate, regular rhythm and normal heart sounds.  Exam reveals no gallop and no friction rub.   No murmur heard. Pulmonary/Chest: Effort normal and breath sounds normal. No respiratory distress. No wheezes.No rales.  Abdominal: Soft. Bowel sounds are normal. No distension. There is no tenderness. There is no guarding.  Musculoskeletal: No edema or tenderness.  Lymphadenopathy:    No cervical or supraclavicular adenopathy.  Neurological: Alert and oriented to person, place, and time. No cranial nerve deficit.  Skin: Skin is warm and dry. No rash noted. No erythema. No pallor.  Multiple tattoos. Psychiatric: Affect and judgment normal.  LABORATORY DATA: I have personally reviewed the data as listed:  Appointment on 07/24/2017  Component Date Value Ref Range Status  . WBC 07/24/2017 6.7  4.0 - 10.5 K/uL Final  . RBC 07/24/2017 3.95  3.87 - 5.11 MIL/uL Final  . Hemoglobin 07/24/2017 13.1  12.0 - 15.0 g/dL Final  . HCT 07/24/2017 39.2  36.0 - 46.0 % Final  . MCV 07/24/2017 99.2  78.0 - 100.0 fL Final  . MCH 07/24/2017 33.2  26.0 - 34.0 pg Final  . MCHC 07/24/2017 33.4  30.0 -  36.0 g/dL Final  . RDW 07/24/2017 13.0  11.5 - 15.5 % Final  . Platelets 07/24/2017 215  150 - 400 K/uL Final  . Neutrophils Relative % 07/24/2017 75  % Final  . Neutro Abs 07/24/2017 5.1  1.7 - 7.7 K/uL Final  . Lymphocytes Relative 07/24/2017 16  % Final  . Lymphs Abs 07/24/2017 1.1  0.7 - 4.0 K/uL Final  . Monocytes Relative 07/24/2017 8  % Final  . Monocytes Absolute 07/24/2017 0.5  0.1 - 1.0 K/uL Final  . Eosinophils Relative 07/24/2017 1  % Final  . Eosinophils Absolute 07/24/2017 0.0  0.0 - 0.7 K/uL Final  . Basophils Relative 07/24/2017 0  % Final  . Basophils Absolute 07/24/2017 0.0  0.0 - 0.1 K/uL Final  . Sodium 07/24/2017 136  135 - 145 mmol/L Final  . Potassium 07/24/2017 3.4* 3.5 - 5.1 mmol/L Final  . Chloride 07/24/2017 99* 101 - 111 mmol/L Final  . CO2 07/24/2017 30  22 - 32 mmol/L Final  . Glucose, Bld 07/24/2017 87  65 - 99 mg/dL Final  . BUN 07/24/2017 8  6 - 20 mg/dL Final  . Creatinine, Ser 07/24/2017 0.80  0.44 - 1.00 mg/dL Final  . Calcium 07/24/2017 8.9  8.9 - 10.3 mg/dL Final  . Total Protein 07/24/2017 7.0  6.5 - 8.1 g/dL Final  . Albumin 07/24/2017 3.8  3.5 - 5.0 g/dL Final  . AST 07/24/2017 13* 15 - 41 U/L Final  . ALT 07/24/2017 7* 14 - 54 U/L Final  . Alkaline Phosphatase 07/24/2017 76  38 - 126 U/L Final  . Total Bilirubin 07/24/2017 0.2* 0.3 - 1.2 mg/dL Final  . GFR calc non Af Amer 07/24/2017 >60  >60 mL/min Final  . GFR calc Af Amer 07/24/2017 >60   >60 mL/min Final   Comment: (NOTE) The eGFR has been calculated using the CKD EPI equation. This calculation has not been validated in all clinical situations. eGFR's persistently <60 mL/min signify possible Chronic Kidney Disease.   . Anion gap 07/24/2017 7  5 - 15 Final  . Erythropoietin 07/24/2017 16.3  2.6 - 18.5 mIU/mL Final   Comment: (NOTE) [employer] UniCel DxI 800 Immunoassay System Performed At: [facility] [place], [place] [id number] Lindon Romp MD IP:[id number]   . Iron 07/24/2017 48  28 - 170 ug/dL Final  . TIBC 07/24/2017 314  250 - 450 ug/dL Final  . Saturation Ratios 07/24/2017 15  10.4 - 31.8 % Final  . UIBC 07/24/2017 266  ug/dL Final   Performed at [facility], [place] [address], [place], [place] [address]  . Ferritin 07/24/2017 23  11 - 307 ng/mL Final   Performed at [facility], [place] [address], [place], [place] [address]  . Vitamin B-12 07/24/2017 539  180 - 914 pg/mL Final   Comment: (NOTE) This assay is not validated for testing neonatal or myeloproliferative syndrome specimens for Vitamin B12 levels. Performed at [facility], [place] [address], [place], [place] [address]   . Folate 07/24/2017 28.0  >5.9 ng/mL Final   Performed at [facility], [place] [address], [place], [place] [address]  . TSH 07/24/2017 1.766  0.350 - 4.500 uIU/mL Final   Performed by a 3rd Generation assay with a functional sensitivity of <=0.01 uIU/mL.  Marland Kitchen Total Protein ELP 07/24/2017 6.5  6.0 - 8.5 g/dL Final  . Albumin ELP 07/24/2017 3.3  2.9 - 4.4 g/dL Final  .  Alpha-1-Globulin 07/24/2017 0.3  0.0 - 0.4 g/dL Final  . Alpha-2-Globulin 07/24/2017 0.8  0.4 - 1.0 g/dL Final  . Beta Globulin 07/24/2017 1.1  0.7 - 1.3 g/dL Final  . Gamma Globulin 07/24/2017 1.1  0.4 - 1.8 g/dL Final  . M-Spike, % 07/24/2017 Not Observed  Not Observed g/dL Final  . SPE Interp. 07/24/2017 Comment   Final   Comment: (NOTE) The SPE pattern appears  essentially unremarkable. Evidence of monoclonal protein is not apparent. Performed At: [facility] [place], [place] [id number] Lindon Romp MD KG:[id number]   . Comment 07/24/2017 Comment   Final   Comment: (NOTE) Protein electrophoresis scan will follow via computer, mail, or courier delivery.   Marland Kitchen GLOBULIN, TOTAL 07/24/2017 3.2  2.2 - 3.9 g/dL Corrected  . A/G Ratio 07/24/2017 1.0  0.7 - 1.7 Corrected  . Total Protein ELP 07/24/2017 6.5  6.0 - 8.5 g/dL Final  . IgG (Immunoglobin G), Serum 07/24/2017 974  700 - 1,600 mg/dL Final  . IgA 07/24/2017 307  87 - 352 mg/dL Final  . IgM (Immunoglobulin M), Srm 07/24/2017 105  26 - 217 mg/dL Final   Comment: (NOTE) Performed At: [facility] [place], [place] [id number] Lindon Romp MD QK:[id number]   . Immunofixation Result, Serum 07/24/2017 Comment   Corrected   An apparent normal immunofixation pattern.  . Retic Ct Pct 07/24/2017 2.7  0.4 - 3.1 % Final  . RBC. 07/24/2017 3.95  3.87 - 5.11 MIL/uL Final  . Retic Count, Absolute 07/24/2017 106.7  19.0 - 186.0 K/uL Final    RADIOGRAPHIC STUDIES: I have personally reviewed the radiological images as listed and agree with the findings in the report  No results found.  ASSESSMENT/PLAN Chronic macrocytosis  PLAN: -Patient's all lab data has been reviewed as a normal folic acid level vitamin B12 level.  Reticulocyte count is normal. Serum protein electrophoresis does not show any evidence of M spike Recent CBCs shows MCV of 99 which is an acceptable range. At this point in time no further investigation is needed the patient's hemoglobin drifts downward that a bone marrow aspiration biopsy can be recommended Patient was advised to get follow-up with primary care physician Patient was also counseled regarding smoking  Forest Gleason, MD  08/05/2017 3:10 PM

## 2017-08-05 NOTE — Patient Instructions (Signed)
[facility]  Discharge Instructions:  You were seen by Dr. Choksi today. _______________________________________________________________  Thank you for choosing [facility] at [facility] to provide your oncology and hematology care.  To afford each patient quality time with our providers, please arrive at least 15 minutes before your scheduled appointment.  You need to re-schedule your appointment if you arrive 10 or more minutes late.  We strive to give you quality time with our providers, and arriving late affects you and other patients whose appointments are after yours.  Also, if you no show three or more times for appointments you may be dismissed from the clinic.  Again, thank you for choosing [facility] at [facility]. Our hope is that these requests will allow you access to exceptional care and in a timely manner. _______________________________________________________________  If you have questions after your visit, please contact our office at [phone] between the hours of 8:30 a.m. and 5:00 p.m. Voicemails left after 4:30 p.m. will not be returned until the following business day. _______________________________________________________________  For prescription refill requests, have your pharmacy contact our office. _______________________________________________________________  Recommendations made by the consultant and any test results will be sent to your referring physician. _______________________________________________________________ 

## 2017-08-06 DIAGNOSIS — M797 Fibromyalgia: Secondary | ICD-10-CM | POA: Diagnosis not present

## 2017-08-06 DIAGNOSIS — M5116 Intervertebral disc disorders with radiculopathy, lumbar region: Secondary | ICD-10-CM | POA: Diagnosis not present

## 2017-08-06 DIAGNOSIS — M5010 Cervical disc disorder with radiculopathy, unspecified cervical region: Secondary | ICD-10-CM | POA: Diagnosis not present

## 2017-08-06 DIAGNOSIS — Z79891 Long term (current) use of opiate analgesic: Secondary | ICD-10-CM | POA: Diagnosis not present

## 2017-08-23 ENCOUNTER — Encounter: Payer: Self-pay | Admitting: Family Medicine

## 2017-08-28 ENCOUNTER — Ambulatory Visit: Payer: PPO | Admitting: Gastroenterology

## 2017-09-04 DIAGNOSIS — M545 Low back pain: Secondary | ICD-10-CM | POA: Diagnosis not present

## 2017-09-04 DIAGNOSIS — Z79891 Long term (current) use of opiate analgesic: Secondary | ICD-10-CM | POA: Diagnosis not present

## 2017-09-04 DIAGNOSIS — F331 Major depressive disorder, recurrent, moderate: Secondary | ICD-10-CM | POA: Diagnosis not present

## 2017-09-04 DIAGNOSIS — G4701 Insomnia due to medical condition: Secondary | ICD-10-CM | POA: Diagnosis not present

## 2017-09-04 DIAGNOSIS — M5416 Radiculopathy, lumbar region: Secondary | ICD-10-CM | POA: Diagnosis not present

## 2017-09-04 DIAGNOSIS — R2689 Other abnormalities of gait and mobility: Secondary | ICD-10-CM | POA: Diagnosis not present

## 2017-09-24 ENCOUNTER — Encounter: Payer: Self-pay | Admitting: Family Medicine

## 2017-10-03 DIAGNOSIS — G4701 Insomnia due to medical condition: Secondary | ICD-10-CM | POA: Diagnosis not present

## 2017-10-03 DIAGNOSIS — Z79891 Long term (current) use of opiate analgesic: Secondary | ICD-10-CM | POA: Diagnosis not present

## 2017-10-03 DIAGNOSIS — M961 Postlaminectomy syndrome, not elsewhere classified: Secondary | ICD-10-CM | POA: Diagnosis not present

## 2017-10-03 DIAGNOSIS — M5416 Radiculopathy, lumbar region: Secondary | ICD-10-CM | POA: Diagnosis not present

## 2017-10-14 DIAGNOSIS — Z79899 Other long term (current) drug therapy: Secondary | ICD-10-CM | POA: Diagnosis not present

## 2017-10-14 DIAGNOSIS — M545 Low back pain: Secondary | ICD-10-CM | POA: Diagnosis not present

## 2017-10-14 DIAGNOSIS — F17200 Nicotine dependence, unspecified, uncomplicated: Secondary | ICD-10-CM | POA: Diagnosis not present

## 2017-10-14 DIAGNOSIS — G8929 Other chronic pain: Secondary | ICD-10-CM | POA: Diagnosis not present

## 2017-10-14 DIAGNOSIS — R1033 Periumbilical pain: Secondary | ICD-10-CM | POA: Diagnosis not present

## 2017-10-14 DIAGNOSIS — R109 Unspecified abdominal pain: Secondary | ICD-10-CM | POA: Diagnosis not present

## 2017-10-31 DIAGNOSIS — M5416 Radiculopathy, lumbar region: Secondary | ICD-10-CM | POA: Diagnosis not present

## 2017-10-31 DIAGNOSIS — M961 Postlaminectomy syndrome, not elsewhere classified: Secondary | ICD-10-CM | POA: Diagnosis not present

## 2017-10-31 DIAGNOSIS — G4701 Insomnia due to medical condition: Secondary | ICD-10-CM | POA: Diagnosis not present

## 2017-10-31 DIAGNOSIS — Z79891 Long term (current) use of opiate analgesic: Secondary | ICD-10-CM | POA: Diagnosis not present

## 2017-11-02 ENCOUNTER — Other Ambulatory Visit: Payer: Self-pay | Admitting: Gastroenterology

## 2017-11-12 ENCOUNTER — Other Ambulatory Visit: Payer: Self-pay | Admitting: Neurology

## 2017-11-12 DIAGNOSIS — G8929 Other chronic pain: Secondary | ICD-10-CM

## 2017-11-12 DIAGNOSIS — M545 Low back pain: Principal | ICD-10-CM

## 2017-11-25 ENCOUNTER — Ambulatory Visit
Admission: RE | Admit: 2017-11-25 | Discharge: 2017-11-25 | Disposition: A | Discharge status: Home / Self Care | Payer: Medicare HMO | Source: Ambulatory Visit | Attending: Neurology | Admitting: Neurology

## 2017-11-25 DIAGNOSIS — M545 Low back pain: Secondary | ICD-10-CM | POA: Diagnosis not present

## 2017-11-25 DIAGNOSIS — G8929 Other chronic pain: Secondary | ICD-10-CM

## 2017-11-25 MED ORDER — IOPAMIDOL (ISOVUE-M 200) INJECTION 41%
1.0000 mL | Freq: Once | INTRAMUSCULAR | Status: AC
  Administered 2017-11-25: 1 mL via EPIDURAL

## 2017-11-25 MED ORDER — METHYLPREDNISOLONE ACETATE 40 MG/ML INJ SUSP (RADIOLOG
120.0000 mg | Freq: Once | INTRAMUSCULAR | Status: AC
  Administered 2017-11-25: 120 mg via EPIDURAL

## 2017-12-02 DIAGNOSIS — G4701 Insomnia due to medical condition: Secondary | ICD-10-CM | POA: Diagnosis not present

## 2017-12-02 DIAGNOSIS — Z79891 Long term (current) use of opiate analgesic: Secondary | ICD-10-CM | POA: Diagnosis not present

## 2017-12-02 DIAGNOSIS — M5412 Radiculopathy, cervical region: Secondary | ICD-10-CM | POA: Diagnosis not present

## 2017-12-02 DIAGNOSIS — M961 Postlaminectomy syndrome, not elsewhere classified: Secondary | ICD-10-CM | POA: Diagnosis not present

## 2017-12-30 ENCOUNTER — Encounter: Payer: Self-pay | Admitting: Family Medicine

## 2017-12-31 DIAGNOSIS — M961 Postlaminectomy syndrome, not elsewhere classified: Secondary | ICD-10-CM | POA: Diagnosis not present

## 2017-12-31 DIAGNOSIS — G4701 Insomnia due to medical condition: Secondary | ICD-10-CM | POA: Diagnosis not present

## 2017-12-31 DIAGNOSIS — M5416 Radiculopathy, lumbar region: Secondary | ICD-10-CM | POA: Diagnosis not present

## 2017-12-31 DIAGNOSIS — M5412 Radiculopathy, cervical region: Secondary | ICD-10-CM | POA: Diagnosis not present

## 2017-12-31 DIAGNOSIS — Z79891 Long term (current) use of opiate analgesic: Secondary | ICD-10-CM | POA: Diagnosis not present

## 2018-01-29 DIAGNOSIS — Z79891 Long term (current) use of opiate analgesic: Secondary | ICD-10-CM | POA: Diagnosis not present

## 2018-01-29 DIAGNOSIS — M5412 Radiculopathy, cervical region: Secondary | ICD-10-CM | POA: Diagnosis not present

## 2018-01-29 DIAGNOSIS — G4701 Insomnia due to medical condition: Secondary | ICD-10-CM | POA: Diagnosis not present

## 2018-01-29 DIAGNOSIS — M961 Postlaminectomy syndrome, not elsewhere classified: Secondary | ICD-10-CM | POA: Diagnosis not present

## 2018-02-05 ENCOUNTER — Telehealth: Payer: Self-pay | Admitting: Family Medicine

## 2018-02-05 NOTE — Telephone Encounter (Signed)
Patient left a voicemail requesting an antibiotic for an abscess tooth. I tried to call her back with the # provided [phone]. No answer, no voicemail.

## 2018-02-05 NOTE — Telephone Encounter (Signed)
Tried to call patient back with Dr.Nelson's recommendations. No answer/No vm

## 2018-02-05 NOTE — Telephone Encounter (Signed)
I will not prescribe by phone.  Can come in tomorrow .  prefer she see a dentist.  Call one more time.  If she needs help,  She will call back.

## 2018-02-05 NOTE — Telephone Encounter (Signed)
Please advise.  Patient cancelled last appt that was a CPE w/ PAP and didn't reschedule.

## 2018-02-06 ENCOUNTER — Encounter: Payer: Self-pay | Admitting: Family Medicine

## 2018-02-06 ENCOUNTER — Other Ambulatory Visit: Payer: Self-pay

## 2018-02-06 ENCOUNTER — Telehealth: Payer: Self-pay | Admitting: Gastroenterology

## 2018-02-06 ENCOUNTER — Ambulatory Visit (INDEPENDENT_AMBULATORY_CARE_PROVIDER_SITE_OTHER): Payer: Medicare HMO | Admitting: Family Medicine

## 2018-02-06 VITALS — BP 106/80 | HR 108 | Temp 99.0°F | Resp 12 | Ht 65.0 in | Wt 156.0 lb

## 2018-02-06 DIAGNOSIS — K0889 Other specified disorders of teeth and supporting structures: Secondary | ICD-10-CM | POA: Diagnosis not present

## 2018-02-06 MED ORDER — PENICILLIN V POTASSIUM 500 MG PO TABS: 500.0000 mg | ORAL_TABLET | Freq: Four times a day (QID) | ORAL | 1 refills | Status: AC

## 2018-02-06 NOTE — Telephone Encounter (Signed)
REVIEWED-NO ADDITIONAL RECOMMENDATIONS. 

## 2018-02-06 NOTE — Patient Instructions (Signed)
Take the penicillin 4 x a day until infection clears Watch for recurrence Try calling your insurance about coverage options Try the local dental school

## 2018-02-06 NOTE — Telephone Encounter (Signed)
AS I was talking to pt, Cynthia PikesSusan skyped me and told me pt has an appt with Dr. Delton SeeNelson today at 3:20 for a toothache. She said oh, my husband must have made the appt, and thanked me.

## 2018-02-06 NOTE — Progress Notes (Signed)
Chief Complaint  Patient presents with  . Dental Pain    left side, started Saturday  Patient is here for an acute visit.  She called yesterday with dental pain requesting an antibiotic.  Request for evaluation.  She has had problems with her left jaw for the last couple of days.  She has swelling of the face, pain in the face, redness of the gums.  She has dentition in terrible repair with multiple fractures, caries, periodontal disease.  She states that she is unable to afford a dentist.  It is not covered to her Medicare insurance.  She does not know if she has had a fever or chills.  She does not have sore throat sinus congestion or any other symptoms.   Patient Active Problem List   Diagnosis Date Noted  . Failed back surgical syndrome 07/23/2017  . Cervical disc disease 07/23/2017  . Tobacco abuse disorder 07/23/2017  . History of abnormal cervical Pap smear 07/23/2017  . Chronic narcotic use 07/23/2017  . Environmental allergies 07/23/2017  . Constipation 05/29/2017  . Macrocytosis without anemia 02/26/2017  . Chronic back pain 08/15/2009    Outpatient Encounter Medications as of 02/06/2018  Medication Sig  . Albuterol Sulfate 108 (90 Base) MCG/ACT AEPB Inhale 2 puffs into the lungs every 6 (six) hours as needed (wheezing /shortness of breath).  . calcium carbonate (OSCAL) 1500 (600 Ca) MG TABS tablet Take 600 mg by mouth daily.  . cyclobenzaprine (FLEXERIL) 10 MG tablet Take 20 mg by mouth at bedtime.   Marland Kitchen FLUoxetine (PROZAC) 40 MG capsule Take 1 capsule (40 mg total) by mouth daily.  . folic acid (FOLVITE) 1 MG tablet Take 1 mg by mouth daily.  Marland Kitchen gabapentin (NEURONTIN) 400 MG capsule Take 400 mg by mouth 3 (three) times daily. Take 1 capsules 400 mg in the morning & 2 capsules (800 mg) at night   . lubiprostone (AMITIZA) 24 MCG capsule Take 48 mcg by mouth daily with breakfast.  . meloxicam (MOBIC) 15 MG tablet Take 15 mg by mouth 2 (two) times daily.  . montelukast  (SINGULAIR) 10 MG tablet Take 10 mg by mouth daily.   Marland Kitchen morphine (MS CONTIN) 15 MG 12 hr tablet Take 15 mg by mouth 2 (two) times daily.  Marland Kitchen oxyCODONE-acetaminophen (PERCOCET) 10-325 MG tablet Take 1 tablet by mouth every 6 (six) hours as needed for pain.  . pantoprazole (PROTONIX) 40 MG tablet TAKE 1 TABLET DAILY, 30 MINUTES BEFORE BREAKFAST.  Marland Kitchen promethazine (PHENERGAN) 25 MG tablet Take 25 mg by mouth every 8 (eight) hours as needed for nausea or vomiting.   . topiramate (TOPAMAX) 25 MG tablet Take 25 mg by mouth as directed. For migraine  . trolamine salicylate (SPORTSCREME) 10 % cream Apply 1 application topically 3 (three) times daily as needed (for back pain).  Marland Kitchen ibuprofen (ADVIL,MOTRIN) 600 MG tablet Take 600 mg by mouth 2 (two) times daily.   . penicillin v potassium (VEETID) 500 MG tablet Take 1 tablet (500 mg total) by mouth 4 (four) times daily.   No facility-administered encounter medications on file as of 02/06/2018.     Allergies  Allergen Reactions  . Tofranil [Imipramine Hcl] Shortness Of Breath    "felt as if she was suffocating/cold & hot/felt like I was going crazy"  . Asa [Aspirin] Other (See Comments)    Told not to take due to mitral valve prolapse  . Tape Rash    Review of Systems  Constitutional: Positive  for fever. Negative for activity change, appetite change and unexpected weight change.  HENT: Positive for dental problem. Negative for congestion, postnasal drip, rhinorrhea and sore throat.   Eyes: Negative for redness and visual disturbance.  Respiratory: Negative for cough and shortness of breath.   Cardiovascular: Negative for chest pain, palpitations and leg swelling.  Gastrointestinal: Negative for abdominal pain, constipation and diarrhea.  Genitourinary: Negative for difficulty urinating and frequency.  Musculoskeletal: Positive for arthralgias and back pain.       Chronic pain managed  Neurological: Positive for headaches. Negative for dizziness.        Intermittent  Psychiatric/Behavioral: Positive for sleep disturbance. Negative for dysphoric mood. The patient is not nervous/anxious.        Current due to pain    Physical Exam  Constitutional: She is oriented to person, place, and time. She appears well-developed and well-nourished. She appears distressed.  Obviously uncomfortable  HENT:  Head: Normocephalic and atraumatic.    Right Ear: External ear normal.  Left Ear: External ear normal.  Nose: Nose normal.  Mouth/Throat: Oropharynx is clear and moist and mucous membranes are normal. Abnormal dentition. Dental abscesses and dental caries present. No oropharyngeal exudate.    Eyes: Pupils are equal, round, and reactive to light. Conjunctivae are normal.  Neck: Normal range of motion.  Cardiovascular: Normal rate, regular rhythm and normal heart sounds.  Pulmonary/Chest: Effort normal and breath sounds normal. She has no wheezes.  Few  Abdominal:  Abdominal obesity  Musculoskeletal: Normal range of motion. She exhibits no edema.  Lymphadenopathy:    She has no cervical adenopathy.  Neurological: She is alert and oriented to person, place, and time.  Psychiatric: She has a normal mood and affect. Her behavior is normal.    BP 106/80   Pulse (!) 108   Temp 99 F (37.2 C) (Oral)   Resp 12   Ht 5\' 5"  (1.651 m)   Wt 156 lb 0.6 oz (70.8 kg)   SpO2 95%   BMI 25.97 kg/m   ASSESSMENT/PLAN:  1. Pain, dental Dental abscess I stressed  the necessity of obtaining dental care.  She needs to go to the free clinic and see if there are dentist will see her.  I know the clinic is full but she could perhaps be put on a waiting list.  She is to call her insurance company to see if there is any condition that would allow dental extractions.  I explained that when her dental condition and infections were this bad, could negatively affect her overall health.  I also advised that she call the local dental school to see if they have a  reduced cost dental clinic available   Patient Instructions  Take the penicillin 4 x a day until infection clears Watch for recurrence Try calling your insurance about coverage options Try the local dental school   Eustace MooreYvonne Sue Nelson, MD

## 2018-02-06 NOTE — Telephone Encounter (Signed)
Sending FYI to Dr. Fields and Anna Boone, NP.  

## 2018-02-06 NOTE — Telephone Encounter (Signed)
Patient has OV today with Dr Delton SeeNelson for a toothache.

## 2018-02-06 NOTE — Telephone Encounter (Signed)
Pt called asking for DS or AB. I told her both of them were with patients and I would have to take a message. Patient said that someone needed to call her back pretty quick because she is wanting an antibiotic for an abscess tooth. I told her we don't treat teeth and she needed to see a dentist. She said that her PCP Dr Delton SeeNelson wasn't at the office anymore and it would be May before she could see the new doctor. Then she said that she didn't want to go to the ER and she doesn't have a dentist. I told her I would pass the message along, but I really didn't think our providers would be treating her for a abscess tooth. [phone]

## 2018-02-06 NOTE — Telephone Encounter (Signed)
Patient on schedule for today.

## 2018-02-06 NOTE — Telephone Encounter (Signed)
Noted  

## 2018-02-13 ENCOUNTER — Telehealth: Payer: Self-pay | Admitting: Family Medicine

## 2018-02-13 NOTE — Telephone Encounter (Signed)
PATIENT LEFT A VOICEMAIL TO DISCUSS MED REFILLS, I CALLED HER BACK TO LET HER KNOW THAT DR.NELSON WAS NO LONGER IN THIS OFFICE. NO ANSWER, NO VOICEMAIL. SHE WAS MAILED A LETTER THAT DR.NELSON WAS LEAVING.

## 2018-03-04 DIAGNOSIS — M5136 Other intervertebral disc degeneration, lumbar region: Secondary | ICD-10-CM | POA: Diagnosis not present

## 2018-03-04 DIAGNOSIS — K219 Gastro-esophageal reflux disease without esophagitis: Secondary | ICD-10-CM | POA: Diagnosis not present

## 2018-03-04 DIAGNOSIS — K5903 Drug induced constipation: Secondary | ICD-10-CM | POA: Diagnosis not present

## 2018-03-04 DIAGNOSIS — R32 Unspecified urinary incontinence: Secondary | ICD-10-CM | POA: Diagnosis not present

## 2018-03-04 DIAGNOSIS — T402X5A Adverse effect of other opioids, initial encounter: Secondary | ICD-10-CM | POA: Diagnosis not present

## 2018-03-04 DIAGNOSIS — G894 Chronic pain syndrome: Secondary | ICD-10-CM | POA: Diagnosis not present

## 2018-03-27 DIAGNOSIS — M5412 Radiculopathy, cervical region: Secondary | ICD-10-CM | POA: Diagnosis not present

## 2018-03-27 DIAGNOSIS — Z79891 Long term (current) use of opiate analgesic: Secondary | ICD-10-CM | POA: Diagnosis not present

## 2018-03-27 DIAGNOSIS — M961 Postlaminectomy syndrome, not elsewhere classified: Secondary | ICD-10-CM | POA: Diagnosis not present

## 2018-03-27 DIAGNOSIS — G4701 Insomnia due to medical condition: Secondary | ICD-10-CM | POA: Diagnosis not present

## 2018-05-27 DIAGNOSIS — M961 Postlaminectomy syndrome, not elsewhere classified: Secondary | ICD-10-CM | POA: Diagnosis not present

## 2018-05-27 DIAGNOSIS — Z79891 Long term (current) use of opiate analgesic: Secondary | ICD-10-CM | POA: Diagnosis not present

## 2018-05-27 DIAGNOSIS — M5412 Radiculopathy, cervical region: Secondary | ICD-10-CM | POA: Diagnosis not present

## 2018-05-27 DIAGNOSIS — G4701 Insomnia due to medical condition: Secondary | ICD-10-CM | POA: Diagnosis not present

## 2018-05-27 DIAGNOSIS — M5416 Radiculopathy, lumbar region: Secondary | ICD-10-CM | POA: Diagnosis not present

## 2018-06-02 DIAGNOSIS — M47816 Spondylosis without myelopathy or radiculopathy, lumbar region: Secondary | ICD-10-CM | POA: Diagnosis not present

## 2018-06-02 DIAGNOSIS — M961 Postlaminectomy syndrome, not elsewhere classified: Secondary | ICD-10-CM | POA: Diagnosis not present

## 2018-06-02 DIAGNOSIS — M797 Fibromyalgia: Secondary | ICD-10-CM | POA: Diagnosis not present

## 2018-06-02 DIAGNOSIS — Z6825 Body mass index (BMI) 25.0-25.9, adult: Secondary | ICD-10-CM | POA: Diagnosis not present

## 2018-06-04 DIAGNOSIS — N644 Mastodynia: Secondary | ICD-10-CM | POA: Diagnosis not present

## 2018-06-04 DIAGNOSIS — Z79899 Other long term (current) drug therapy: Secondary | ICD-10-CM | POA: Diagnosis not present

## 2018-06-04 DIAGNOSIS — F17200 Nicotine dependence, unspecified, uncomplicated: Secondary | ICD-10-CM | POA: Diagnosis not present

## 2018-06-23 DIAGNOSIS — Z79899 Other long term (current) drug therapy: Secondary | ICD-10-CM | POA: Diagnosis not present

## 2018-06-23 DIAGNOSIS — E119 Type 2 diabetes mellitus without complications: Secondary | ICD-10-CM | POA: Diagnosis not present

## 2018-06-23 DIAGNOSIS — F17200 Nicotine dependence, unspecified, uncomplicated: Secondary | ICD-10-CM | POA: Diagnosis not present

## 2018-06-23 DIAGNOSIS — L0591 Pilonidal cyst without abscess: Secondary | ICD-10-CM | POA: Diagnosis not present

## 2018-06-23 DIAGNOSIS — L0501 Pilonidal cyst with abscess: Secondary | ICD-10-CM | POA: Diagnosis not present

## 2018-07-09 DIAGNOSIS — M461 Sacroiliitis, not elsewhere classified: Secondary | ICD-10-CM | POA: Diagnosis not present

## 2018-07-24 DIAGNOSIS — G4701 Insomnia due to medical condition: Secondary | ICD-10-CM | POA: Diagnosis not present

## 2018-07-24 DIAGNOSIS — M961 Postlaminectomy syndrome, not elsewhere classified: Secondary | ICD-10-CM | POA: Diagnosis not present

## 2018-07-24 DIAGNOSIS — Z79891 Long term (current) use of opiate analgesic: Secondary | ICD-10-CM | POA: Diagnosis not present

## 2018-07-24 DIAGNOSIS — M5412 Radiculopathy, cervical region: Secondary | ICD-10-CM | POA: Diagnosis not present

## 2018-08-17 DIAGNOSIS — M545 Low back pain: Secondary | ICD-10-CM | POA: Diagnosis not present

## 2018-08-17 DIAGNOSIS — F17210 Nicotine dependence, cigarettes, uncomplicated: Secondary | ICD-10-CM | POA: Diagnosis not present

## 2018-08-17 DIAGNOSIS — Z79899 Other long term (current) drug therapy: Secondary | ICD-10-CM | POA: Diagnosis not present

## 2018-08-17 DIAGNOSIS — Z7984 Long term (current) use of oral hypoglycemic drugs: Secondary | ICD-10-CM | POA: Diagnosis not present

## 2018-08-17 DIAGNOSIS — N200 Calculus of kidney: Secondary | ICD-10-CM | POA: Diagnosis not present

## 2018-08-17 DIAGNOSIS — R1032 Left lower quadrant pain: Secondary | ICD-10-CM | POA: Diagnosis not present

## 2018-08-17 DIAGNOSIS — G8929 Other chronic pain: Secondary | ICD-10-CM | POA: Diagnosis not present

## 2018-08-29 ENCOUNTER — Other Ambulatory Visit: Payer: Self-pay | Admitting: Gastroenterology

## 2018-09-13 DIAGNOSIS — N61 Mastitis without abscess: Secondary | ICD-10-CM | POA: Diagnosis not present

## 2018-09-13 DIAGNOSIS — G8929 Other chronic pain: Secondary | ICD-10-CM | POA: Diagnosis not present

## 2018-09-15 DIAGNOSIS — M961 Postlaminectomy syndrome, not elsewhere classified: Secondary | ICD-10-CM | POA: Diagnosis not present

## 2018-09-15 DIAGNOSIS — G4701 Insomnia due to medical condition: Secondary | ICD-10-CM | POA: Diagnosis not present

## 2018-09-15 DIAGNOSIS — Z79891 Long term (current) use of opiate analgesic: Secondary | ICD-10-CM | POA: Diagnosis not present

## 2018-09-15 DIAGNOSIS — M5412 Radiculopathy, cervical region: Secondary | ICD-10-CM | POA: Diagnosis not present

## 2018-10-07 DIAGNOSIS — Z136 Encounter for screening for cardiovascular disorders: Secondary | ICD-10-CM | POA: Diagnosis not present

## 2018-10-07 DIAGNOSIS — Z23 Encounter for immunization: Secondary | ICD-10-CM | POA: Diagnosis not present

## 2018-10-07 DIAGNOSIS — R7302 Impaired glucose tolerance (oral): Secondary | ICD-10-CM | POA: Diagnosis not present

## 2018-10-07 DIAGNOSIS — Z124 Encounter for screening for malignant neoplasm of cervix: Secondary | ICD-10-CM | POA: Diagnosis not present

## 2018-10-07 DIAGNOSIS — R87610 Atypical squamous cells of undetermined significance on cytologic smear of cervix (ASC-US): Secondary | ICD-10-CM | POA: Diagnosis not present

## 2018-10-07 DIAGNOSIS — Z13220 Encounter for screening for lipoid disorders: Secondary | ICD-10-CM | POA: Diagnosis not present

## 2018-10-07 DIAGNOSIS — Z0000 Encounter for general adult medical examination without abnormal findings: Secondary | ICD-10-CM | POA: Diagnosis not present

## 2018-10-07 DIAGNOSIS — N644 Mastodynia: Secondary | ICD-10-CM | POA: Diagnosis not present

## 2018-10-08 DIAGNOSIS — Z136 Encounter for screening for cardiovascular disorders: Secondary | ICD-10-CM | POA: Diagnosis not present

## 2018-10-08 DIAGNOSIS — Z13220 Encounter for screening for lipoid disorders: Secondary | ICD-10-CM | POA: Diagnosis not present

## 2018-10-08 DIAGNOSIS — R7302 Impaired glucose tolerance (oral): Secondary | ICD-10-CM | POA: Diagnosis not present

## 2018-10-08 DIAGNOSIS — Z0000 Encounter for general adult medical examination without abnormal findings: Secondary | ICD-10-CM | POA: Diagnosis not present

## 2018-10-09 ENCOUNTER — Other Ambulatory Visit: Payer: Self-pay | Admitting: Family Medicine

## 2018-10-09 DIAGNOSIS — N644 Mastodynia: Secondary | ICD-10-CM

## 2018-10-09 DIAGNOSIS — N6310 Unspecified lump in the right breast, unspecified quadrant: Secondary | ICD-10-CM

## 2018-10-13 ENCOUNTER — Other Ambulatory Visit: Payer: Medicare HMO

## 2018-10-13 ENCOUNTER — Inpatient Hospital Stay: Admission: RE | Admit: 2018-10-13 | Payer: Medicare HMO | Source: Ambulatory Visit

## 2018-10-20 ENCOUNTER — Other Ambulatory Visit: Payer: Medicare HMO

## 2019-06-02 ENCOUNTER — Other Ambulatory Visit: Payer: Self-pay | Admitting: Neurology

## 2019-06-02 DIAGNOSIS — M5416 Radiculopathy, lumbar region: Secondary | ICD-10-CM

## 2019-06-09 ENCOUNTER — Ambulatory Visit
Admission: RE | Admit: 2019-06-09 | Discharge: 2019-06-09 | Disposition: A | Discharge status: Home / Self Care | Payer: Medicare Other | Source: Ambulatory Visit | Attending: Neurology | Admitting: Neurology

## 2019-06-09 ENCOUNTER — Other Ambulatory Visit: Payer: Self-pay

## 2019-06-09 DIAGNOSIS — M5416 Radiculopathy, lumbar region: Secondary | ICD-10-CM

## 2019-06-09 MED ORDER — IOPAMIDOL (ISOVUE-M 200) INJECTION 41%
1.0000 mL | Freq: Once | INTRAMUSCULAR | Status: AC
  Administered 2019-06-09: 1 mL via EPIDURAL

## 2019-06-09 MED ORDER — METHYLPREDNISOLONE ACETATE 40 MG/ML INJ SUSP (RADIOLOG
120.0000 mg | Freq: Once | INTRAMUSCULAR | Status: AC
  Administered 2019-06-09: 120 mg via EPIDURAL

## 2019-06-09 NOTE — Discharge Instructions (Signed)

## 2019-06-30 ENCOUNTER — Other Ambulatory Visit: Payer: Self-pay | Admitting: Nurse Practitioner

## 2019-07-01 ENCOUNTER — Telehealth: Payer: Self-pay

## 2019-07-01 NOTE — Telephone Encounter (Signed)
Pt called to see if she could get a refill on her Zofran. She was last seen here 05/29/2017. Not scheduled to come back. I see that it is in refill box.

## 2019-07-01 NOTE — Telephone Encounter (Signed)
Refill zofran X 1. Needs ov.

## 2019-07-02 ENCOUNTER — Encounter: Payer: Self-pay | Admitting: Gastroenterology

## 2019-07-02 NOTE — Telephone Encounter (Signed)
LMOM for pt that Rx sent in x 1 and she needs OV. Forwarding to [place] to schedule OV.

## 2019-07-02 NOTE — Telephone Encounter (Signed)
PATIENT SCHEDULED AND LETTER SENT  °

## 2019-07-24 ENCOUNTER — Ambulatory Visit: Payer: Medicare Other | Admitting: Gastroenterology

## 2019-07-24 ENCOUNTER — Telehealth: Payer: Self-pay | Admitting: Gastroenterology

## 2019-07-24 ENCOUNTER — Encounter: Payer: Self-pay | Admitting: Gastroenterology

## 2019-07-24 NOTE — Telephone Encounter (Signed)
PATIENT WAS A NO SHOW AND LETTER SENT  °

## 2019-08-10 IMAGING — CT CT ABD-PELV W/ CM
2 of 5 series · 17 of 46 positions shown, 19 images · IV contrast (Isovue)
Comparison: None.

CLINICAL DATA: Chronic abdominal swelling, vomiting. Lower
abdominal pain.

EXAM:
CT ABDOMEN AND PELVIS WITH CONTRAST
TECHNIQUE: Multidetector CT imaging of the abdomen and pelvis was performed
using the standard protocol following bolus administration of
intravenous contrast.
CONTRAST:  100mL [id number] IOPAMIDOL ([id number]) INJECTION 61%

[Series 2: axial st · axial · 0.66mm/px · z∈[+833,+1233]mm · 14 of 91 slices shown, 16 images]
[im 6/91  soft-tissue]
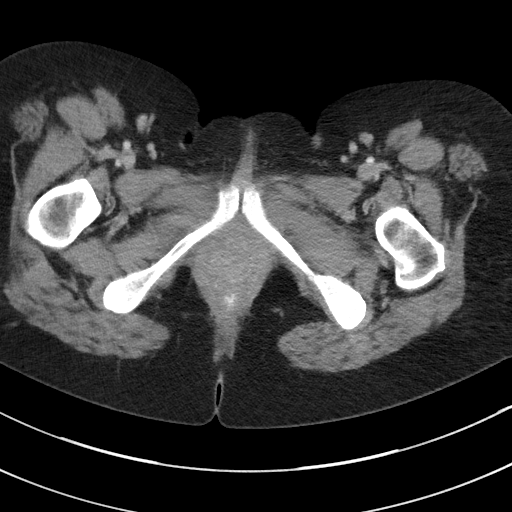
[im 6/91  bone]
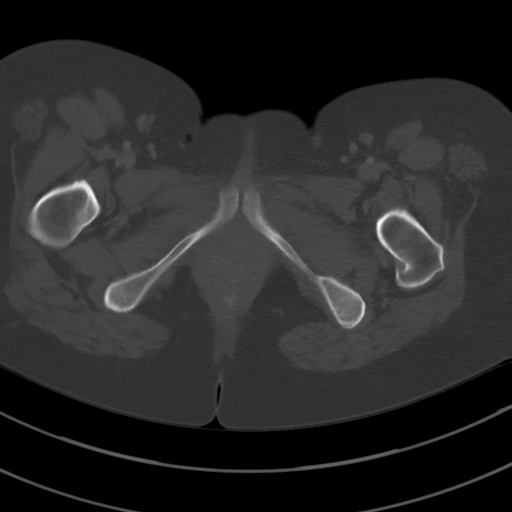
[im 11/91  soft-tissue]
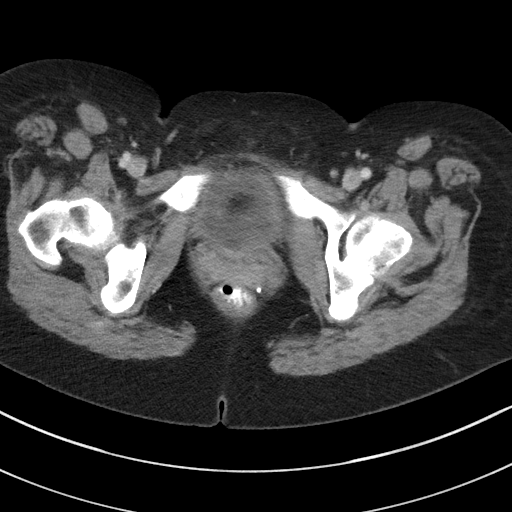
[im 21/91  soft-tissue]
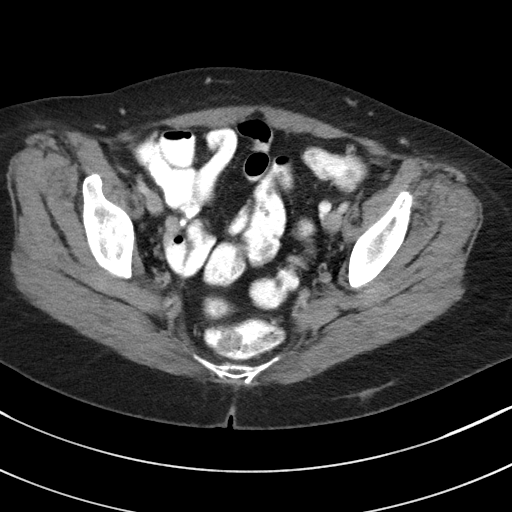
[im 26/91  soft-tissue]
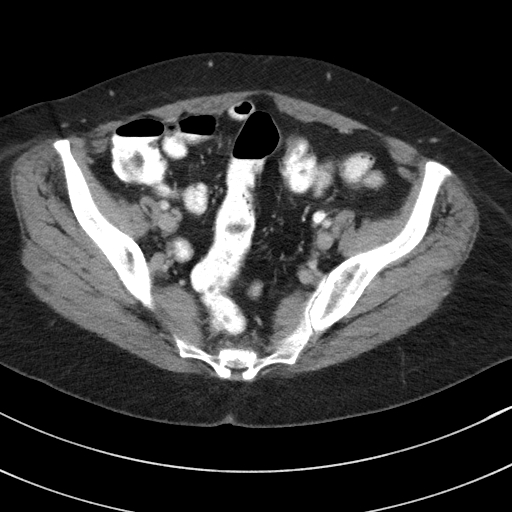
[im 31/91  soft-tissue]
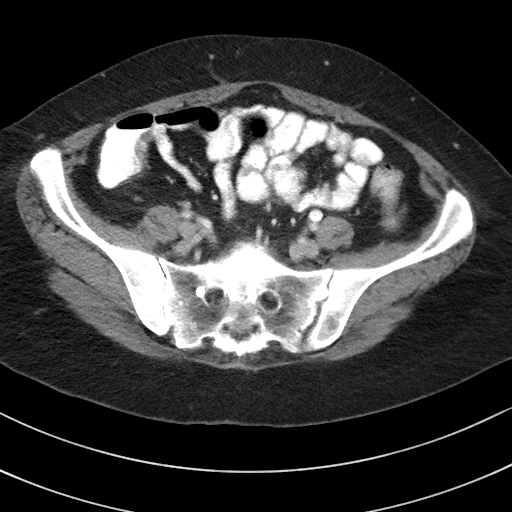
[im 36/91  soft-tissue]
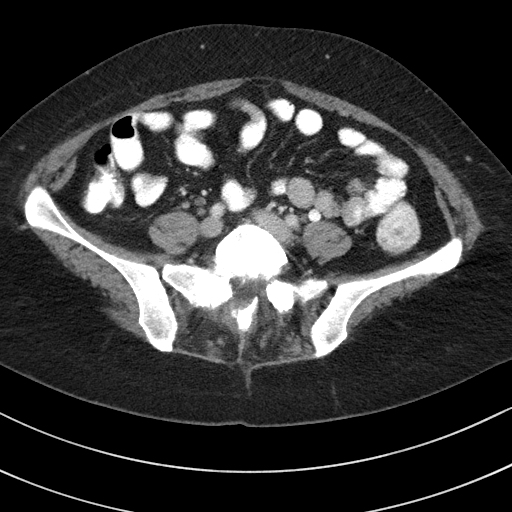
[im 41/91  soft-tissue]
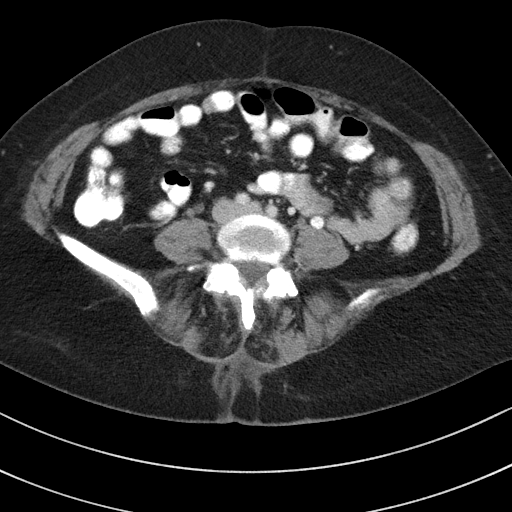
[im 51/91  soft-tissue]
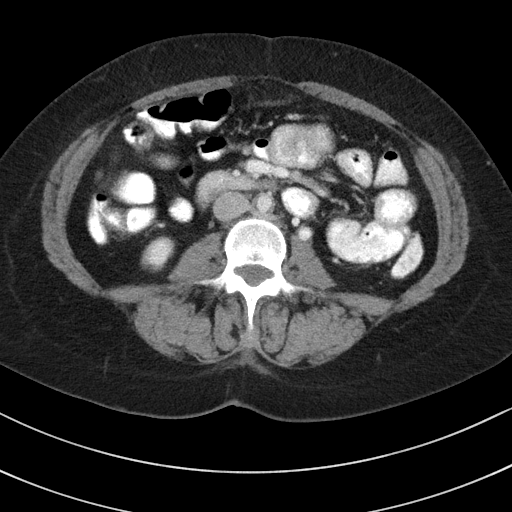
[im 56/91  soft-tissue]
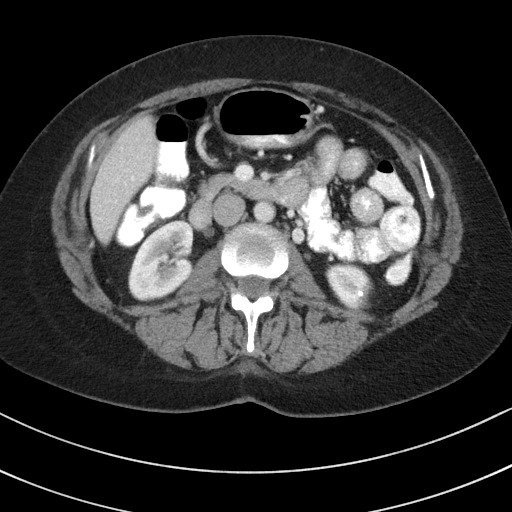
[im 56/91  bone]
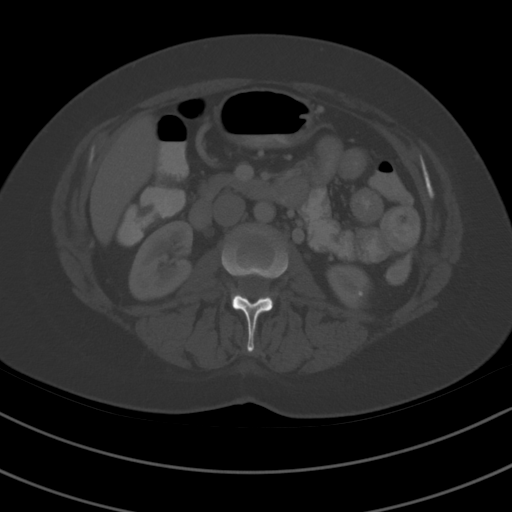
[im 61/91  soft-tissue]
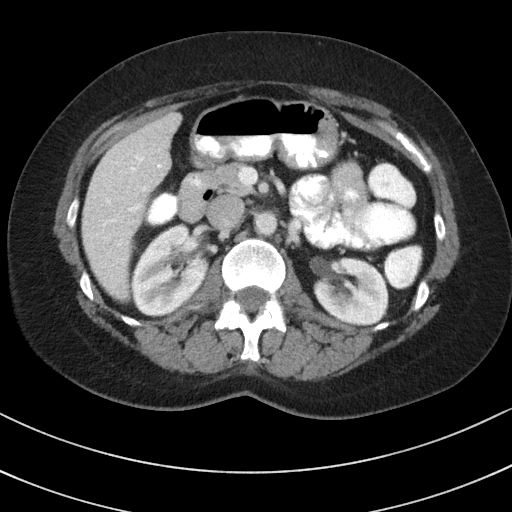
[im 66/91  soft-tissue]
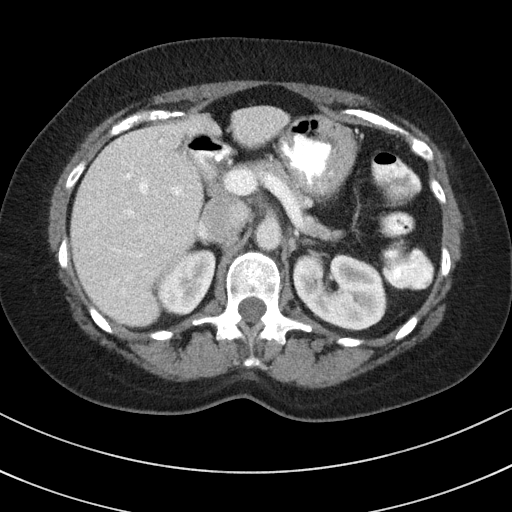
[im 71/91  soft-tissue]
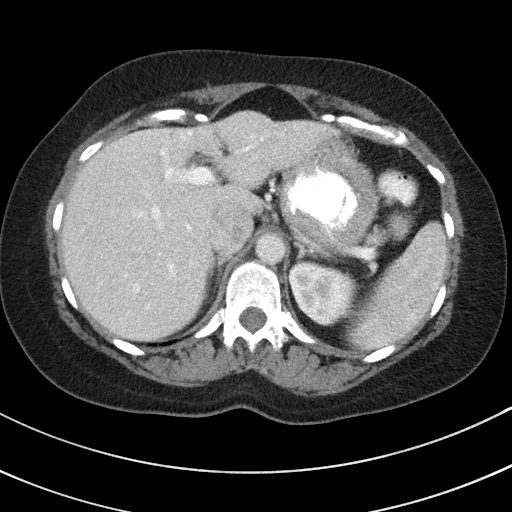
[im 81/91  soft-tissue]
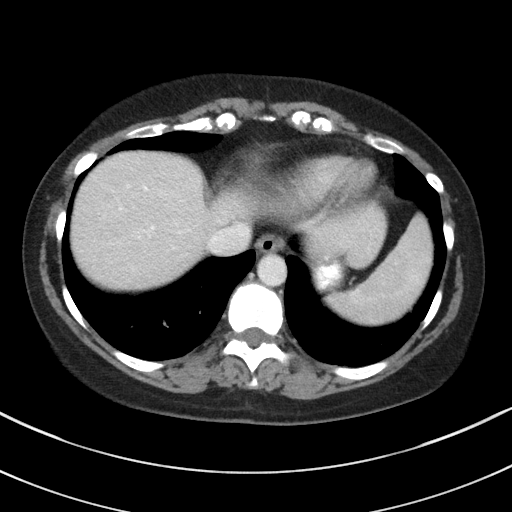
[im 86/91  soft-tissue]
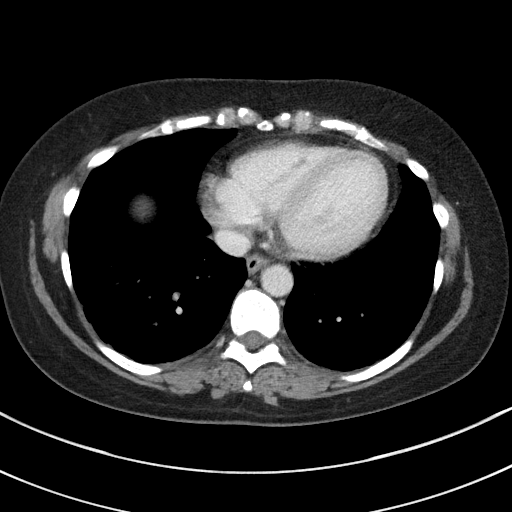

[Series 6: coronal st · coronal · 0.79mm/px · 3 of 91 slices shown]
[im 31/91  soft-tissue]
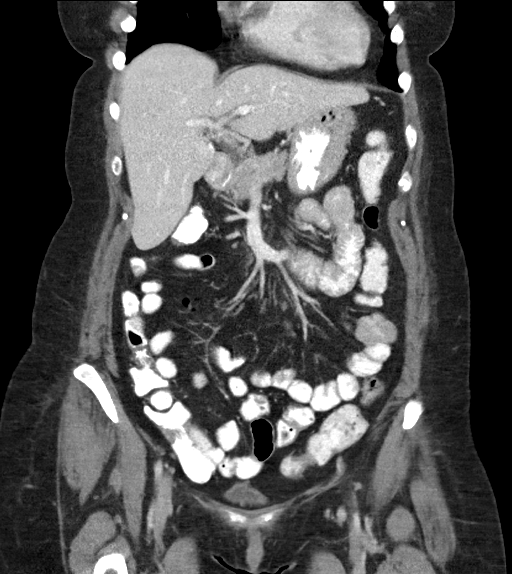
[im 41/91  soft-tissue]
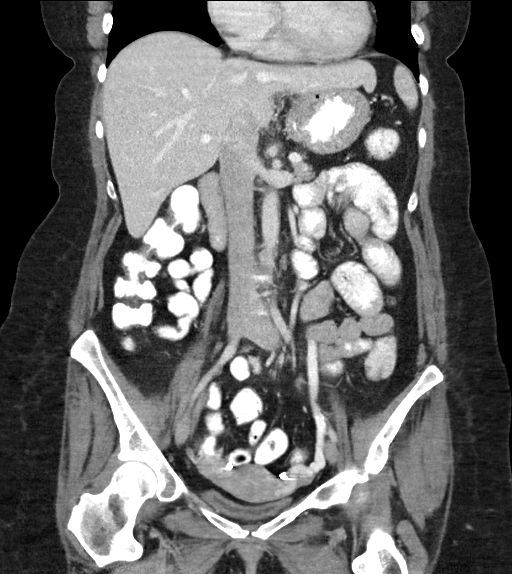
[im 51/91  soft-tissue]
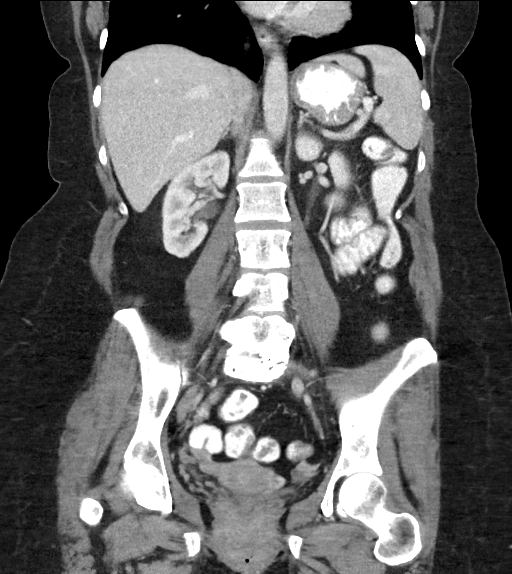

[17 of 46 positions shown; findings below may reference images not displayed]

FINDINGS: Lower chest: No acute abnormality.

Hepatobiliary: No focal liver abnormality is seen. Status post
cholecystectomy. No biliary dilatation.

Pancreas: Unremarkable. No pancreatic ductal dilatation or
surrounding inflammatory changes.

Spleen: Normal in size without focal abnormality.

Adrenals/Urinary Tract: Adrenal glands appear normal. Cortical
calcification is seen in lower pole of left kidney. No
hydronephrosis or renal obstruction is noted. No renal or ureteral
calculi are noted. Urinary bladder appears normal.

Stomach/Bowel: The stomach appears normal. There is no evidence of
bowel obstruction or inflammation.

Vascular/Lymphatic: No significant vascular findings are present. No
enlarged abdominal or pelvic lymph nodes.

Reproductive: Uterus and bilateral adnexa are unremarkable.

Other: No abdominal wall hernia or abnormality. No abdominopelvic
ascites.

Musculoskeletal: No acute or significant osseous findings.
IMPRESSION: No acute abnormality seen in the abdomen or pelvis.

## 2022-07-03 DIAGNOSIS — I872 Venous insufficiency (chronic) (peripheral): Secondary | ICD-10-CM | POA: Diagnosis not present

## 2022-07-03 DIAGNOSIS — Z6826 Body mass index (BMI) 26.0-26.9, adult: Secondary | ICD-10-CM | POA: Diagnosis not present

## 2022-07-05 ENCOUNTER — Other Ambulatory Visit: Payer: Self-pay | Admitting: Internal Medicine

## 2022-07-05 ENCOUNTER — Other Ambulatory Visit (HOSPITAL_COMMUNITY): Payer: Self-pay | Admitting: Internal Medicine

## 2022-07-05 DIAGNOSIS — M79661 Pain in right lower leg: Secondary | ICD-10-CM

## 2022-07-06 ENCOUNTER — Ambulatory Visit (HOSPITAL_COMMUNITY)
Admission: RE | Admit: 2022-07-06 | Discharge: 2022-07-06 | Disposition: A | Discharge status: Home / Self Care | Payer: Medicare HMO | Source: Ambulatory Visit | Attending: Internal Medicine | Admitting: Internal Medicine

## 2022-07-06 DIAGNOSIS — M79661 Pain in right lower leg: Secondary | ICD-10-CM

## 2022-07-06 DIAGNOSIS — M7989 Other specified soft tissue disorders: Secondary | ICD-10-CM | POA: Insufficient documentation

## 2022-08-31 DIAGNOSIS — H524 Presbyopia: Secondary | ICD-10-CM | POA: Diagnosis not present

## 2022-09-20 DIAGNOSIS — Z0100 Encounter for examination of eyes and vision without abnormal findings: Secondary | ICD-10-CM | POA: Diagnosis not present

## 2022-09-23 DIAGNOSIS — K1379 Other lesions of oral mucosa: Secondary | ICD-10-CM | POA: Diagnosis not present

## 2022-09-23 DIAGNOSIS — K047 Periapical abscess without sinus: Secondary | ICD-10-CM | POA: Diagnosis not present

## 2022-09-23 DIAGNOSIS — K0889 Other specified disorders of teeth and supporting structures: Secondary | ICD-10-CM | POA: Diagnosis not present

## 2022-11-20 DIAGNOSIS — J09X3 Influenza due to identified novel influenza A virus with gastrointestinal manifestations: Secondary | ICD-10-CM | POA: Diagnosis not present

## 2022-11-20 DIAGNOSIS — Z6826 Body mass index (BMI) 26.0-26.9, adult: Secondary | ICD-10-CM | POA: Diagnosis not present

## 2022-11-23 DIAGNOSIS — Z0100 Encounter for examination of eyes and vision without abnormal findings: Secondary | ICD-10-CM | POA: Diagnosis not present

## 2023-01-02 DIAGNOSIS — L239 Allergic contact dermatitis, unspecified cause: Secondary | ICD-10-CM | POA: Diagnosis not present

## 2023-02-06 DIAGNOSIS — S93601A Unspecified sprain of right foot, initial encounter: Secondary | ICD-10-CM | POA: Diagnosis not present

## 2023-02-06 DIAGNOSIS — M25571 Pain in right ankle and joints of right foot: Secondary | ICD-10-CM | POA: Diagnosis not present

## 2023-02-06 DIAGNOSIS — M79671 Pain in right foot: Secondary | ICD-10-CM | POA: Diagnosis not present

## 2023-02-06 DIAGNOSIS — S93491A Sprain of other ligament of right ankle, initial encounter: Secondary | ICD-10-CM | POA: Diagnosis not present

## 2023-02-14 ENCOUNTER — Other Ambulatory Visit (INDEPENDENT_AMBULATORY_CARE_PROVIDER_SITE_OTHER): Payer: Medicare HMO

## 2023-02-14 ENCOUNTER — Ambulatory Visit (INDEPENDENT_AMBULATORY_CARE_PROVIDER_SITE_OTHER): Payer: Medicare HMO | Admitting: Orthopaedic Surgery

## 2023-02-14 ENCOUNTER — Encounter: Payer: Self-pay | Admitting: Orthopaedic Surgery

## 2023-02-14 VITALS — Ht 64.0 in | Wt 156.0 lb

## 2023-02-14 DIAGNOSIS — M25571 Pain in right ankle and joints of right foot: Secondary | ICD-10-CM

## 2023-02-14 NOTE — Progress Notes (Signed)
Office Visit Note   Patient: Cynthia Finley           Date of Birth: 11/01/1970           MRN: [id number] Visit Date: 02/14/2023              Requested by: No referring provider defined for this encounter. PCP: System, Provider Not In   Assessment & Plan: Visit Diagnoses:  1. Pain in right ankle and joints of right foot     Plan: Patient has a lateral ankle sprain with dependent edema.  She will elevate her foot put her support stockings on first thing in the morning and then reapply the cam boot.  Principal problem has been swelling.  We discussed 3 to 6 weeks timeframe for most ankle sprains.  She can return if she has ongoing problems.   Follow-Up Instructions: No follow-ups on file.   Orders:  Orders Placed This Encounter  Procedures   XR Ankle Complete Right   No orders of the defined types were placed in this encounter.     Procedures: No procedures performed   Clinical Data: No additional findings.   Subjective: Chief Complaint  Patient presents with   Right Ankle - Pain    DOI 02/05/2023    HPI68 year old female was injured on 02/05/2023 went to urgent care in [place] had x-rays and radiologist read as possible lateral malleolar injury.  Patient tripped going up the basement stairs which were cement.  She has been in a cam boot had swelling considerably of the forefoot with erythema.  Patient does not wear she has had multiple back surgeries and had instrumentation removed and disc arthroplasty placed on L5 -S1.  She is use Tylenol and ibuprofen for the pain.  No history of DVT and she has had anterolateral ankle swelling since her original injury 1 week ago.   Review of Systems previous back surgery patient not working states she is disabled from her back.   Objective: Vital Signs: Ht  (1.626 m)   Wt 156 lb (70.8 kg)   BMI 26.78 kg/m   Physical Exam Constitutional:      Appearance: She is well-developed.  HENT:     Head: Normocephalic.      Right Ear: External ear normal.     Left Ear: External ear normal. There is no impacted cerumen.  Eyes:     Pupils: Pupils are equal, round, and reactive to light.  Neck:     Thyroid: No thyromegaly.     Trachea: No tracheal deviation.  Cardiovascular:     Rate and Rhythm: Normal rate.  Pulmonary:     Effort: Pulmonary effort is normal.  Abdominal:     Palpations: Abdomen is soft.  Musculoskeletal:     Cervical back: No rigidity.  Skin:    General: Skin is warm and dry.  Neurological:     Mental Status: She is alert and oriented to person, place, and time.  Psychiatric:        Behavior: Behavior normal.     Ortho Exam tenderness anterior talofibular ligament syndesmosis nontender negative anterior drawer no tenderness over the deltoid ligament.  No ecchymosis but she does have considerable soft tissue swelling anterolateral ankle that extends down into the forefoot.  Specialty Comments:  No specialty comments available.  Imaging: No results found.   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   Cervical disc disease  07/23/2017   Tobacco abuse disorder 07/23/2017   History of abnormal cervical Pap smear 07/23/2017   Chronic narcotic use 07/23/2017   Environmental allergies 07/23/2017   Constipation 05/29/2017   Macrocytosis without anemia 02/26/2017   Chronic back pain 08/15/2009   Past Medical History:  Diagnosis Date   Allergy    Anemia    Anxiety    Chronic back pain    Depression    Fibromyalgia    GERD (gastroesophageal reflux disease)    Heart murmur    Hypertension    IBS (irritable bowel syndrome)    Insomnia    Migraine    Mitral valve prolapse    Neuromuscular disorder     Family History  Adopted: Yes  Problem Relation Age of Onset   Cancer Mother        lung   Hypertension Mother    Parkinson's disease Maternal Grandmother    Kidney disease Maternal Grandmother         dialysis   Hypertension Maternal Grandmother    Early death Maternal Grandfather        MVA   Colon cancer Neg Hx    Colon polyps Neg Hx     Past Surgical History:  Procedure Laterality Date   BACK SURGERY     X 6   CHOLECYSTECTOMY     DILATION AND CURETTAGE OF UTERUS     SPINE SURGERY     six back surgeries   TONSILLECTOMY     Social History   Occupational History   Occupation: disability    Comment: back  Tobacco Use   Smoking status: Every Day    Packs/day: 1.00    Years: 25.00    Additional pack years: 0.00    Total pack years: 25.00    Types: Cigarettes    Start date: 10/23/1991   Smokeless tobacco: Never  Vaping Use   Vaping Use: Former  Substance and Sexual Activity   Alcohol use: No    Comment: rare   Drug use: No   Sexual activity: Yes    Birth control/protection: Surgical

## 2023-02-17 DIAGNOSIS — K047 Periapical abscess without sinus: Secondary | ICD-10-CM | POA: Diagnosis not present

## 2023-02-22 ENCOUNTER — Encounter: Payer: Self-pay | Admitting: Orthopaedic Surgery

## 2023-06-22 DIAGNOSIS — J069 Acute upper respiratory infection, unspecified: Secondary | ICD-10-CM | POA: Diagnosis not present

## 2023-08-12 DIAGNOSIS — I872 Venous insufficiency (chronic) (peripheral): Secondary | ICD-10-CM | POA: Diagnosis not present

## 2023-08-12 DIAGNOSIS — Z6826 Body mass index (BMI) 26.0-26.9, adult: Secondary | ICD-10-CM | POA: Diagnosis not present

## 2023-11-04 DIAGNOSIS — J0100 Acute maxillary sinusitis, unspecified: Secondary | ICD-10-CM | POA: Diagnosis not present

## 2023-11-28 DIAGNOSIS — K047 Periapical abscess without sinus: Secondary | ICD-10-CM | POA: Diagnosis not present

## 2024-01-12 DIAGNOSIS — R112 Nausea with vomiting, unspecified: Secondary | ICD-10-CM | POA: Diagnosis not present

## 2024-02-03 DIAGNOSIS — K047 Periapical abscess without sinus: Secondary | ICD-10-CM | POA: Diagnosis not present

## 2024-03-01 DIAGNOSIS — S30860A Insect bite (nonvenomous) of lower back and pelvis, initial encounter: Secondary | ICD-10-CM | POA: Diagnosis not present

## 2024-03-01 DIAGNOSIS — W57XXXA Bitten or stung by nonvenomous insect and other nonvenomous arthropods, initial encounter: Secondary | ICD-10-CM | POA: Diagnosis not present

## 2024-03-02 DIAGNOSIS — W57XXXA Bitten or stung by nonvenomous insect and other nonvenomous arthropods, initial encounter: Secondary | ICD-10-CM | POA: Diagnosis not present

## 2024-03-02 DIAGNOSIS — S30860A Insect bite (nonvenomous) of lower back and pelvis, initial encounter: Secondary | ICD-10-CM | POA: Diagnosis not present

## 2024-04-22 DIAGNOSIS — R051 Acute cough: Secondary | ICD-10-CM | POA: Diagnosis not present

## 2024-04-22 DIAGNOSIS — M791 Myalgia, unspecified site: Secondary | ICD-10-CM | POA: Diagnosis not present

## 2024-04-22 DIAGNOSIS — J069 Acute upper respiratory infection, unspecified: Secondary | ICD-10-CM | POA: Diagnosis not present

## 2024-04-23 DIAGNOSIS — J069 Acute upper respiratory infection, unspecified: Secondary | ICD-10-CM | POA: Diagnosis not present

## 2024-04-23 DIAGNOSIS — M791 Myalgia, unspecified site: Secondary | ICD-10-CM | POA: Diagnosis not present

## 2024-05-04 DIAGNOSIS — J019 Acute sinusitis, unspecified: Secondary | ICD-10-CM | POA: Diagnosis not present

## 2024-05-04 DIAGNOSIS — M778 Other enthesopathies, not elsewhere classified: Secondary | ICD-10-CM | POA: Diagnosis not present

## 2024-05-04 DIAGNOSIS — B9789 Other viral agents as the cause of diseases classified elsewhere: Secondary | ICD-10-CM | POA: Diagnosis not present

## 2024-05-04 DIAGNOSIS — J069 Acute upper respiratory infection, unspecified: Secondary | ICD-10-CM | POA: Diagnosis not present

## 2024-06-25 DIAGNOSIS — R0981 Nasal congestion: Secondary | ICD-10-CM | POA: Diagnosis not present

## 2024-06-25 DIAGNOSIS — R600 Localized edema: Secondary | ICD-10-CM | POA: Diagnosis not present

## 2024-06-25 DIAGNOSIS — R110 Nausea: Secondary | ICD-10-CM | POA: Diagnosis not present

## 2024-06-25 DIAGNOSIS — R0982 Postnasal drip: Secondary | ICD-10-CM | POA: Diagnosis not present

## 2024-06-30 DIAGNOSIS — K047 Periapical abscess without sinus: Secondary | ICD-10-CM | POA: Diagnosis not present
